# Patient Record
Sex: Female | Born: 1980 | Race: Black or African American | Hispanic: No | Marital: Married | Smoking: Never smoker
Health system: Southern US, Community
[De-identification: ages and names within clinical notes are randomized; demographics above are authoritative.]

## PROBLEM LIST (undated history)

## (undated) DIAGNOSIS — G932 Benign intracranial hypertension: Secondary | ICD-10-CM

## (undated) DIAGNOSIS — L732 Hidradenitis suppurativa: Secondary | ICD-10-CM

## (undated) HISTORY — DX: Benign intracranial hypertension: G93.2

## (undated) HISTORY — PX: AXILLARY HIDRADENITIS EXCISION: SUR522

## (undated) HISTORY — PX: ABDOMINAL HYSTERECTOMY: SHX81

---

## 2010-07-24 DIAGNOSIS — Z6841 Body Mass Index (BMI) 40.0 and over, adult: Secondary | ICD-10-CM | POA: Insufficient documentation

## 2012-01-11 DIAGNOSIS — N739 Female pelvic inflammatory disease, unspecified: Secondary | ICD-10-CM | POA: Insufficient documentation

## 2016-06-13 DIAGNOSIS — L732 Hidradenitis suppurativa: Secondary | ICD-10-CM | POA: Insufficient documentation

## 2016-06-16 DIAGNOSIS — B3731 Acute candidiasis of vulva and vagina: Secondary | ICD-10-CM | POA: Insufficient documentation

## 2018-12-22 ENCOUNTER — Other Ambulatory Visit: Payer: Self-pay

## 2018-12-22 ENCOUNTER — Encounter: Payer: Self-pay | Admitting: Emergency Medicine

## 2018-12-22 ENCOUNTER — Emergency Department
Admission: EM | Admit: 2018-12-22 | Discharge: 2018-12-22 | Disposition: A | Discharge status: Home / Self Care | Payer: BC Managed Care – PPO | Source: Home / Self Care | Attending: Family Medicine | Admitting: Family Medicine

## 2018-12-22 DIAGNOSIS — L732 Hidradenitis suppurativa: Secondary | ICD-10-CM

## 2018-12-22 HISTORY — DX: Hidradenitis suppurativa: L73.2

## 2018-12-22 MED ORDER — METHYLPREDNISOLONE SODIUM SUCC 125 MG IJ SOLR
80.0000 mg | Freq: Once | INTRAMUSCULAR | Status: AC
  Administered 2018-12-22: 80 mg via INTRAMUSCULAR

## 2018-12-22 MED ORDER — HYDROCODONE-ACETAMINOPHEN 5-325 MG PO TABS: ORAL_TABLET | ORAL | 0 refills | Status: DC

## 2018-12-22 MED ORDER — CLINDAMYCIN HCL 300 MG PO CAPS: ORAL_CAPSULE | ORAL | 0 refills | Status: DC

## 2018-12-22 MED ORDER — PREDNISONE 20 MG PO TABS: ORAL_TABLET | ORAL | 0 refills | Status: DC

## 2018-12-22 NOTE — ED Provider Notes (Signed)
[facility]    CSN: [id number] Arrival date & time: 12/22/18  [id number]     History   Chief Complaint Chief Complaint  Patient presents with  . Abscess    HPI Allison York is a 38 y.o. female.   Patient has a long history of hidradenitis suppurativa, managed presently by Humira.  She just moved to the area, and has had a flare-up from the stress of moving.  She has had multiple treatments in the past, including surgery, topical clindamycin, minocycline, and intralesional steroids.  She presently has a draining lesion on her chest, right groin and right buttock. She feels well otherwise.  No fevers, chills, and sweats. She denies history of diabetes, but has family history of diabetes.  The history is provided by the patient.    Past Medical History:  Diagnosis Date  . Hidradenitis suppurativa     There are no active problems to display for this patient.   Past Surgical History:  Procedure Laterality Date  . ABDOMINAL HYSTERECTOMY    . AXILLARY HIDRADENITIS EXCISION      OB History   No obstetric history on file.      Home Medications    Prior to Admission medications   Medication Sig Start Date End Date Taking? Authorizing Provider  Adalimumab (HUMIRA) 10 MG/0.1ML PSKT Inject into the skin.   Yes [provider]  clindamycin (CLEOCIN) 300 MG capsule Take one cap PO Q8hr 12/22/18   Lattie HawBeese, Eston Heslin A, MD  HYDROcodone-acetaminophen (NORCO/VICODIN) 5-325 MG tablet Take one by mouth at bedtime as needed for pain 12/22/18   Lattie HawBeese, Alayasia Breeding A, MD  predniSONE (DELTASONE) 20 MG tablet Take one tab by mouth twice daily for 4 days, then one daily for 3 days. Take with food. 12/22/18   Lattie HawBeese, Zlata Alcaide A, MD    Family History Family History  Problem Relation Age of Onset  . Cancer Mother   . Diabetes Father   . Hypertension Father     Social History Social History   Tobacco Use  . Smoking status: Never Smoker  . Smokeless tobacco: Never Used   Substance Use Topics  . Alcohol use: Yes  . Drug use: Not Currently     Allergies   Patient has no known allergies.   Review of Systems Review of Systems  Constitutional: Negative for appetite change, chills, diaphoresis, fatigue and fever.  Skin: Positive for wound.  All other systems reviewed and are negative.    Physical Exam Triage Vital Signs ED Triage Vitals  Enc Vitals Group     BP      Pulse      Resp      Temp      Temp src      SpO2      Weight      Height      Head Circumference      Peak Flow      Pain Score      Pain Loc      Pain Edu?      Excl. in GC?    No data found.  Updated Vital Signs BP 127/88 (BP Location: Right Arm)   Pulse 75   Temp 97.9 F (36.6 C) (Oral)   Ht 5\' 6"  (1.676 m)   Wt 110.2 kg   SpO2 97%   BMI 39.22 kg/m   Visual Acuity Right Eye Distance:   Left Eye Distance:   Bilateral Distance:    Right Eye Near:  Left Eye Near:    Bilateral Near:     Physical Exam Vitals signs and nursing note reviewed.  Constitutional:      General: She is not in acute distress.    Appearance: She is obese.  HENT:     Head: Normocephalic.     Mouth/Throat:     Mouth: Mucous membranes are moist.  Eyes:     Conjunctiva/sclera: Conjunctivae normal.     Pupils: Pupils are equal, round, and reactive to light.  Cardiovascular:     Rate and Rhythm: Normal rate.  Pulmonary:     Effort: Pulmonary effort is normal.  Musculoskeletal:     Right lower leg: No edema.     Left lower leg: No edema.  Skin:    General: Skin is warm and dry.          Comments: Numerous areas of scarring axillary areas, groin, buttocks.  Draining fluctuant lesion mid-chest.  Indurated lesions right groin and right buttock.  Neurological:     Mental Status: She is alert.      UC Treatments / Results  Labs (all labs ordered are listed, but only abnormal results are displayed) Labs Reviewed  WOUND CULTURE    EKG   Radiology No results found.   Procedures Procedures (including critical care time)  Medications Ordered in UC Medications  methylPREDNISolone sodium succinate (SOLU-MEDROL) 125 mg/2 mL injection 80 mg (has no administration in time range)    Initial Impression / Assessment and Plan / UC Course  I have reviewed the triage vital signs and the nursing notes.  Pertinent labs & imaging results that were available during my care of the patient were reviewed by me and considered in my medical decision making (see chart for details).    Administered Solumedrol 80mg  IM; begin prednisone burst/taper. Culture pending from purulent fluid lesion mid-chest. Begin Clindamycin 300mg  Q8hr. Rx for Lortab at bedtime (#5, no refill). Controlled Substance Prescriptions I have consulted the [place] Controlled Substances Registry for this patient, and feel the risk/benefit ratio today is favorable for proceeding with this prescription for a controlled substance.   Followup with Dermatologist for long term management. Patient has risk factors for metabolic syndrome; ?polycystic ovarian syndrome.  Would benefit from trial of metformin.   Final Clinical Impressions(s) / UC Diagnoses   Final diagnoses:  Hidradenitis suppurativa     Discharge Instructions     Begin prednisone Thursday 12/23/18. May take Tylenol as needed for pain daytime.    ED Prescriptions    Medication Sig Dispense Auth. Provider   clindamycin (CLEOCIN) 300 MG capsule Take one cap PO Q8hr 21 capsule Kandra Nicolas, MD   predniSONE (DELTASONE) 20 MG tablet Take one tab by mouth twice daily for 4 days, then one daily for 3 days. Take with food. 11 tablet Kandra Nicolas, MD   HYDROcodone-acetaminophen (NORCO/VICODIN) 5-325 MG tablet Take one by mouth at bedtime as needed for pain 5 tablet Kandra Nicolas, MD         Kandra Nicolas, MD 12/22/18 1038

## 2018-12-22 NOTE — Discharge Instructions (Addendum)
Begin prednisone Thursday 12/23/18. May take Tylenol as needed for pain daytime.

## 2018-12-22 NOTE — ED Triage Notes (Signed)
Hidradenitis recurrent, started 2 days ago, in groin, under left breast, under buttock. Patient just moved here this week.

## 2018-12-24 ENCOUNTER — Telehealth: Payer: Self-pay

## 2018-12-24 MED ORDER — FLUCONAZOLE 150 MG PO TABS: 150.0000 mg | ORAL_TABLET | Freq: Once | ORAL | 0 refills | Status: AC

## 2018-12-24 NOTE — Telephone Encounter (Signed)
Pt called and stated that she gets yeast infections with antibiotics.  Spoke with Tilden Fossa, PAC, diflucan 150 mg sent to pharmacy

## 2018-12-25 ENCOUNTER — Telehealth: Payer: Self-pay | Admitting: Emergency Medicine

## 2018-12-25 LAB — WOUND CULTURE
MICRO NUMBER:: 646062
SPECIMEN QUALITY:: ADEQUATE

## 2018-12-25 NOTE — Telephone Encounter (Signed)
Patient informed of negative would culture results.  Patient states that she is doing better and will follow up as needed with PCP.

## 2019-01-20 ENCOUNTER — Emergency Department
Admission: EM | Admit: 2019-01-20 | Discharge: 2019-01-20 | Disposition: A | Discharge status: Home / Self Care | Payer: BC Managed Care – PPO | Source: Home / Self Care | Attending: Emergency Medicine | Admitting: Emergency Medicine

## 2019-01-20 ENCOUNTER — Other Ambulatory Visit: Payer: Self-pay

## 2019-01-20 ENCOUNTER — Emergency Department (INDEPENDENT_AMBULATORY_CARE_PROVIDER_SITE_OTHER): Payer: BC Managed Care – PPO

## 2019-01-20 DIAGNOSIS — H579 Unspecified disorder of eye and adnexa: Secondary | ICD-10-CM

## 2019-01-20 DIAGNOSIS — R930 Abnormal findings on diagnostic imaging of skull and head, not elsewhere classified: Secondary | ICD-10-CM

## 2019-01-20 DIAGNOSIS — R51 Headache: Secondary | ICD-10-CM | POA: Diagnosis not present

## 2019-01-20 DIAGNOSIS — E236 Other disorders of pituitary gland: Secondary | ICD-10-CM

## 2019-01-20 DIAGNOSIS — R110 Nausea: Secondary | ICD-10-CM

## 2019-01-20 NOTE — ED Provider Notes (Signed)
[facility]    CSN: [id number] Arrival date & time: 01/20/19  0915     History   Chief Complaint Chief Complaint  Patient presents with  . Headache  . Tinnitus  . Nausea    HPI Allison York is a 38 y.o. female.   HPI Patient presents with a 4-day history of a bifrontal headache and a level of 6-7.  She has had nausea but no vomiting.  She has not had a fever.  She has tried ibuprofen and Tylenol without improvement.  She has not had a history of headaches.  She is currently on Humira for hidradenitis separative.  She does have a ringing in her ears at times. Past Medical History:  Diagnosis Date  . Hidradenitis suppurativa     There are no active problems to display for this patient.   Past Surgical History:  Procedure Laterality Date  . ABDOMINAL HYSTERECTOMY    . AXILLARY HIDRADENITIS EXCISION    . CESAREAN SECTION      OB History   No obstetric history on file.      Home Medications    Prior to Admission medications   Medication Sig Start Date End Date Taking? Authorizing Provider  Adalimumab (HUMIRA) 10 MG/0.1ML PSKT Inject into the skin.    [provider]  clindamycin (CLEOCIN) 300 MG capsule Take one cap PO Q8hr 12/22/18   Lattie HawBeese, Stephen A, MD  HYDROcodone-acetaminophen (NORCO/VICODIN) 5-325 MG tablet Take one by mouth at bedtime as needed for pain 12/22/18   Lattie HawBeese, Stephen A, MD  predniSONE (DELTASONE) 20 MG tablet Take one tab by mouth twice daily for 4 days, then one daily for 3 days. Take with food. 12/22/18   Lattie HawBeese, Stephen A, MD    Family History Family History  Problem Relation Age of Onset  . Cancer Mother   . Diabetes Father   . Hypertension Father     Social History Social History   Tobacco Use  . Smoking status: Never Smoker  . Smokeless tobacco: Never Used  Substance Use Topics  . Alcohol use: Yes  . Drug use: Not Currently     Allergies   Patient has no known allergies.   Review of Systems Review of  Systems  Constitutional: Negative.   HENT:       She has had a ringing muffled sensation in her ears but no sore throat  Eyes: Negative.   Respiratory: Negative.   Cardiovascular: Negative.   Gastrointestinal: Negative.      Physical Exam Triage Vital Signs ED Triage Vitals  Enc Vitals Group     BP 01/20/19 1021 134/84     Pulse Rate 01/20/19 1021 66     Resp 01/20/19 1021 20     Temp 01/20/19 1021 98.2 F (36.8 C)     Temp Source 01/20/19 1021 Oral     SpO2 01/20/19 1021 100 %     Weight 01/20/19 1022 236 lb (107 kg)     Height 01/20/19 1022 5\' 6"  (1.676 m)     Head Circumference --      Peak Flow --      Pain Score 01/20/19 1022 7     Pain Loc --      Pain Edu? --      Excl. in GC? --    No data found.  Updated Vital Signs BP 134/84 (BP Location: Right Arm)   Pulse 66   Temp 98.2 F (36.8 C) (Oral)   Resp  20   Ht 5\' 6"  (1.676 m)   Wt 107 kg   SpO2 100%   BMI 38.09 kg/m   Visual Acuity Right Eye Distance:   Left Eye Distance:   Bilateral Distance:    Right Eye Near:   Left Eye Near:    Bilateral Near:     Physical Exam Constitutional:      Appearance: She is well-developed.  HENT:     Head: Normocephalic.  Eyes:     Extraocular Movements: Extraocular movements intact.     Pupils: Pupils are equal, round, and reactive to light.     Comments: This margins were indistinct on the left side.  The right optic disc could not be visualized.  Neck:     Musculoskeletal: Normal range of motion and neck supple.  Cardiovascular:     Rate and Rhythm: Normal rate and regular rhythm.  Pulmonary:     Effort: Pulmonary effort is normal.     Breath sounds: Normal breath sounds.  Neurological:     Mental Status: She is alert.     Cranial Nerves: No cranial nerve deficit or facial asymmetry.     Gait: Gait normal.     Deep Tendon Reflexes: Reflexes normal.  Psychiatric:        Mood and Affect: Mood normal.        Speech: Speech normal.        Behavior:  Behavior normal.      UC Treatments / Results  Labs (all labs ordered are listed, but only abnormal results are displayed) Labs Reviewed  NOVEL CORONAVIRUS, NAA    EKG   Radiology Ct Head Wo Contrast  Result Date: 01/20/2019 CLINICAL DATA:  Acute headache.  Suspect pseudotumor cerebri. EXAM: CT HEAD WITHOUT CONTRAST TECHNIQUE: Contiguous axial images were obtained from the base of the skull through the vertex without intravenous contrast. COMPARISON:  None. FINDINGS: Brain: Ventricle size normal. Negative for infarct, hemorrhage, mass. Marked enlargement of the sella which is filled with CSF. No significant pituitary tissue is identified. Findings compatible with empty sella syndrome. Vascular: Negative for hyperdense vessel Skull: Negative Sinuses/Orbits: Negative Other: None IMPRESSION: Empty sella. Marked enlargement of the sella filled with CSF with compressed or hypoplastic pituitary. This finding can be associated with pseudotumor cerebri however is nonspecific. Otherwise negative Electronically Signed   By: Marlan Palauharles  Clark M.D.   On: 01/20/2019 12:03    Procedures Procedures (including critical care time)  Medications Ordered in UC Medications - No data to display  Initial Impression / Assessment and Plan / UC Course  I have reviewed the triage vital signs and the nursing notes. I was concerned these headaches could be representative of pseudotumor cerebri.  Of note she is on Humira for hidradenitis separative.  She has not had fever or chills.  She has had multiple people in her office test positive for COVID but she has not had testing until today.  Called Dr. Coral Elseyan's office and spoke to his nurse.  He is having me fax the medical record to their office and they will contact her with an appointment.  In the interim the patient will take extra strength Tylenol 2 up to twice a day.  She will also be on ibuprofen 200 mg 3 tablets twice a day with food pending her evaluation with a  neurologist.  Cover testing was done.  She is given a note for work for the rest of this week and next week.  She was advised we  do not have a diagnosis yet we have an abnormal CT scan which needs to be evaluated as well as further evaluation of the abnormal eye exam. Pertinent labs & imaging results that were available during my care of the patient were reviewed by me and considered in my medical decision making (see chart for details).      Final Clinical Impressions(s) / UC Diagnoses   Final diagnoses:  Abnormal CT of the head  Empty sella syndrome (HCC)  Nausea  Eye exam abnormal     Discharge Instructions     You are being referred to Dr. run #who is with [facility] neurology for evaluation. Their phone number is [phone]. I have spoken with their nurse and they will contact you this afternoon about an early appointment. You can take ibuprofen 200 mg number 3 tablets twice a day with food. You can take 2 extra strength Tylenol up to twice a day. We have done COVID testing. He will be out of work for the next 10 days while you undergo your evaluation by the neurologist.    ED Prescriptions    None     Controlled Substance Prescriptions [place] Controlled Substance Registry consulted? Not Applicable   Darlyne Russian, MD 01/20/19 1248

## 2019-01-20 NOTE — Discharge Instructions (Addendum)
You are being referred to Dr. run #who is with [facility] neurology for evaluation. Their phone number is [phone]. I have spoken with their nurse and they will contact you this afternoon about an early appointment. You can take ibuprofen 200 mg number 3 tablets twice a day with food. You can take 2 extra strength Tylenol up to twice a day. We have done COVID testing. He will be out of work for the next 10 days while you undergo your evaluation by the neurologist. You are being evaluated for pseudotumor cerebra and empty sella syndrome

## 2019-01-20 NOTE — ED Triage Notes (Signed)
Headaches for the pat 3-4 days.  Has been waking from sleep with headache.  Had had a Wooshing sound in ears last 2 days.  Several people in work environment have tested positive for [place]

## 2019-01-21 DIAGNOSIS — G932 Benign intracranial hypertension: Secondary | ICD-10-CM | POA: Insufficient documentation

## 2019-01-22 ENCOUNTER — Telehealth: Payer: Self-pay | Admitting: Emergency Medicine

## 2019-01-22 LAB — NOVEL CORONAVIRUS, NAA: SARS-CoV-2, NAA: NOT DETECTED

## 2019-06-20 DIAGNOSIS — L280 Lichen simplex chronicus: Secondary | ICD-10-CM | POA: Insufficient documentation

## 2020-04-16 ENCOUNTER — Encounter: Payer: Self-pay | Admitting: Emergency Medicine

## 2020-04-16 ENCOUNTER — Emergency Department
Admission: EM | Admit: 2020-04-16 | Discharge: 2020-04-16 | Disposition: A | Discharge status: Home / Self Care | Payer: BC Managed Care – PPO | Source: Home / Self Care

## 2020-04-16 ENCOUNTER — Emergency Department: Admit: 2020-04-16 | Discharge status: Absent without leave | Payer: Self-pay

## 2020-04-16 ENCOUNTER — Other Ambulatory Visit: Payer: Self-pay

## 2020-04-16 ENCOUNTER — Emergency Department (INDEPENDENT_AMBULATORY_CARE_PROVIDER_SITE_OTHER): Payer: BC Managed Care – PPO

## 2020-04-16 DIAGNOSIS — R059 Cough, unspecified: Secondary | ICD-10-CM

## 2020-04-16 DIAGNOSIS — R6889 Other general symptoms and signs: Secondary | ICD-10-CM

## 2020-04-16 DIAGNOSIS — R509 Fever, unspecified: Secondary | ICD-10-CM

## 2020-04-16 DIAGNOSIS — L732 Hidradenitis suppurativa: Secondary | ICD-10-CM

## 2020-04-16 MED ORDER — ACETAMINOPHEN 500 MG PO TABS
1000.0000 mg | ORAL_TABLET | Freq: Once | ORAL | Status: AC
  Administered 2020-04-16: 1000 mg via ORAL

## 2020-04-16 MED ORDER — METHYLPREDNISOLONE SODIUM SUCC 40 MG IJ SOLR
80.0000 mg | Freq: Once | INTRAMUSCULAR | Status: AC
  Administered 2020-04-16: 80 mg via INTRAMUSCULAR

## 2020-04-16 MED ORDER — ALBUTEROL SULFATE HFA 108 (90 BASE) MCG/ACT IN AERS: 1.0000 | INHALATION_SPRAY | Freq: Four times a day (QID) | RESPIRATORY_TRACT | 0 refills | Status: DC | PRN

## 2020-04-16 MED ORDER — DOXYCYCLINE HYCLATE 100 MG PO CAPS: 100.0000 mg | ORAL_CAPSULE | Freq: Two times a day (BID) | ORAL | 0 refills | Status: DC

## 2020-04-16 NOTE — ED Provider Notes (Signed)
[facility]    CSN: [id number] Arrival date & time: 04/16/20  0903      History   Chief Complaint Chief Complaint  Patient presents with  . Cough    HPI Allison York is a 39 y.o. female.   HPI  Allison York is a 39 y.o. female presenting to UC with c/o 5 days of body aches, cough, congestion, fever. She had a negative COVID test 2 days ago. She has also been vaccinated with Pfizer COVID vaccine in March.  Denies sick contacts. She does have a hx of hidradenitris suppurativa and states that has flared up in her Left underarm and buttock again as well. States she has done well with steroid injection and antibiotics last time she had a flare. Denies n/v/d. No chest pain or SOB.    Past Medical History:  Diagnosis Date  . Hidradenitis suppurativa     There are no problems to display for this patient.   Past Surgical History:  Procedure Laterality Date  . ABDOMINAL HYSTERECTOMY    . AXILLARY HIDRADENITIS EXCISION    . CESAREAN SECTION      OB History   No obstetric history on file.      Home Medications    Prior to Admission medications   Medication Sig Start Date End Date Taking? Authorizing Provider  acetaZOLAMIDE (DIAMOX) 125 MG tablet Take 125 mg by mouth 3 (three) times daily.   Yes [provider]  dextromethorphan (DELSYM) 30 MG/5ML liquid Take by mouth as needed for cough.   Yes [provider]  DM-Phenylephrine-Acetaminophen (VICKS DAYQUIL COLD & FLU) 10-5-325 MG/15ML LIQD Take by mouth.   Yes [provider]  ibuprofen (ADVIL) 200 MG tablet Take 200 mg by mouth every 6 (six) hours as needed.   Yes [provider]  Adalimumab (HUMIRA) 10 MG/0.1ML PSKT Inject into the skin.    [provider]  albuterol (VENTOLIN HFA) 108 (90 Base) MCG/ACT inhaler Inhale 1-2 puffs into the lungs every 6 (six) hours as needed for wheezing or shortness of breath. 04/16/20   Lurene Shadow, PA-C  doxycycline  (VIBRAMYCIN) 100 MG capsule Take 1 capsule (100 mg total) by mouth 2 (two) times daily. 04/16/20   Lurene Shadow, PA-C    Family History Family History  Problem Relation Age of Onset  . Cancer Mother   . Diabetes Father   . Hypertension Father     Social History Social History   Tobacco Use  . Smoking status: Never Smoker  . Smokeless tobacco: Never Used  Vaping Use  . Vaping Use: Never used  Substance Use Topics  . Alcohol use: Yes  . Drug use: Not Currently     Allergies   Patient has no known allergies.   Review of Systems Review of Systems  Constitutional: Positive for chills, fatigue and fever.  HENT: Positive for congestion. Negative for ear pain, sore throat, trouble swallowing and voice change.   Respiratory: Positive for cough. Negative for shortness of breath.   Cardiovascular: Negative for chest pain and palpitations.  Gastrointestinal: Negative for abdominal pain, diarrhea, nausea and vomiting.  Musculoskeletal: Positive for arthralgias, back pain and myalgias.  Skin: Positive for color change. Negative for rash.  All other systems reviewed and are negative.    Physical Exam Triage Vital Signs ED Triage Vitals  Enc Vitals Group     BP 04/16/20 0927 124/82     Pulse Rate 04/16/20 0927 (!) 115  Resp --      Temp 04/16/20 0927 (!) 100.7 F (38.2 C)     Temp Source 04/16/20 0927 Oral     SpO2 04/16/20 0927 95 %     Weight 04/16/20 0928 233 lb (105.7 kg)     Height 04/16/20 0928 5\' 6"  (1.676 m)     Head Circumference --      Peak Flow --      Pain Score 04/16/20 0928 8     Pain Loc --      Pain Edu? --      Excl. in GC? --    No data found.  Updated Vital Signs BP 124/82 (BP Location: Right Arm)   Pulse (!) 115   Temp (!) 100.7 F (38.2 C) (Oral)   Ht 5\' 6"  (1.676 m)   Wt 233 lb (105.7 kg)   SpO2 95%   BMI 37.61 kg/m   Visual Acuity Right Eye Distance:   Left Eye Distance:   Bilateral Distance:    Right Eye Near:   Left Eye  Near:    Bilateral Near:     Physical Exam Vitals and nursing note reviewed.  Constitutional:      General: She is not in acute distress.    Appearance: Normal appearance. She is well-developed. She is obese. She is not ill-appearing, toxic-appearing or diaphoretic.  HENT:     Head: Normocephalic and atraumatic.     Right Ear: Tympanic membrane and ear canal normal.     Left Ear: Tympanic membrane and ear canal normal.     Nose: Nose normal.     Mouth/Throat:     Mouth: Mucous membranes are moist.     Pharynx: Oropharynx is clear.  Cardiovascular:     Rate and Rhythm: Normal rate and regular rhythm.  Pulmonary:     Effort: Pulmonary effort is normal. No respiratory distress.     Breath sounds: Normal breath sounds. No stridor. No wheezing, rhonchi or rales.  Abdominal:     General: There is no distension.     Palpations: Abdomen is soft.     Tenderness: There is no abdominal tenderness.  Musculoskeletal:        General: Normal range of motion.     Cervical back: Normal range of motion and neck supple. No tenderness.  Lymphadenopathy:     Cervical: No cervical adenopathy.  Skin:    General: Skin is warm and dry.     Findings: Erythema present.       Neurological:     Mental Status: She is alert and oriented to person, place, and time.  Psychiatric:        Behavior: Behavior normal.      UC Treatments / Results  Labs (all labs ordered are listed, but only abnormal results are displayed) Labs Reviewed  COVID-19, FLU A+B AND RSV   Narrative:    Test(s) 140142-Influenza A, NAA; 140143-Influenza B, NAA; 140144- RSV, NAA was developed and its performance characteristics determined by [facility]. It has not been cleared or approved by the Food and Drug Administration. Performed at:  [address], [place], [address]  [place] [facility] Director: [place] MD, Phone:  [phone]    EKG   Radiology  Narrative & Impression  CLINICAL DATA:   Cough, congestion, fever  EXAM: CHEST - 2 VIEW  COMPARISON:  None.  FINDINGS: The heart size and mediastinal contours are within normal limits. Both lungs are clear. No pleural effusion.  The visualized skeletal structures are unremarkable.  IMPRESSION: No acute process in the chest.   Electronically Signed   By: Guadlupe Spanish M.D.   On: 04/16/2020 10:12    Procedures Procedures (including critical care time)  Medications Ordered in UC Medications  acetaminophen (TYLENOL) tablet 1,000 mg (1,000 mg Oral Given 04/16/20 1009)  methylPREDNISolone sodium succinate (SOLU-MEDROL) 40 mg/mL injection 80 mg (80 mg Intramuscular Given 04/16/20 1009)    Initial Impression / Assessment and Plan / UC Course  I have reviewed the triage vital signs and the nursing notes.  Pertinent labs & imaging results that were available during my care of the patient were reviewed by me and considered in my medical decision making (see chart for details).     Will tx as previously tx for hidradenitis flare with solumedrol and antibiotics Due to recent use of clindamycin, will change pt to doxycycline today F/u with PCP AVS given  Final Clinical Impressions(s) / UC Diagnoses   Final diagnoses:  Flu-like symptoms  Hidradenitis suppurativa     Discharge Instructions      You may take 500mg  acetaminophen every 4-6 hours or in combination with ibuprofen 400-600mg  every 6-8 hours as needed for pain, inflammation, and fever.  Be sure to well hydrated with clear liquids and get at least 8 hours of sleep at night, preferably more while sick.   Please follow up with family medicine in 1 week if needed.     ED Prescriptions    Medication Sig Dispense Auth. Provider   doxycycline (VIBRAMYCIN) 100 MG capsule Take 1 capsule (100 mg total) by mouth 2 (two) times daily. 20 capsule , Raquon Milledge O, PA-C   albuterol (VENTOLIN HFA) 108 (90 Base) MCG/ACT inhaler Inhale 1-2 puffs into the lungs  every 6 (six) hours as needed for wheezing or shortness of breath. 18 g Doroteo Glassman, PA-C     PDMP not reviewed this encounter.   Lurene Shadow, Lurene Shadow 04/19/20 [phone]

## 2020-04-16 NOTE — Discharge Instructions (Signed)
  You may take 500mg acetaminophen every 4-6 hours or in combination with ibuprofen 400-600mg every 6-8 hours as needed for pain, inflammation, and fever.  Be sure to well hydrated with clear liquids and get at least 8 hours of sleep at night, preferably more while sick.   Please follow up with family medicine in 1 week if needed.   

## 2020-04-16 NOTE — ED Triage Notes (Addendum)
Cough, body aches, fever x 5 days, had negative Covid test on Saturday, she was vaccinated in March

## 2020-04-18 LAB — COVID-19, FLU A+B AND RSV
Influenza A, NAA: NOT DETECTED
Influenza B, NAA: NOT DETECTED
RSV, NAA: NOT DETECTED
SARS-CoV-2, NAA: NOT DETECTED

## 2020-04-19 ENCOUNTER — Telehealth: Payer: Self-pay | Admitting: Emergency Medicine

## 2020-04-19 NOTE — Telephone Encounter (Signed)
Call back to [facility] regarding a message left on nurse line to request additional time on her return to work note. Pt still has a fever and a sore throat. Pt was seen by Waylan Rocher, PA-C on Monday. Per Denny Peon, pt would need to be seen again or follow up w/ her PCP. Pt stated she did not have a PCP. RN stated pt needed to be seen for re-evaluation

## 2020-06-11 DIAGNOSIS — D84821 Immunodeficiency due to drugs: Secondary | ICD-10-CM | POA: Insufficient documentation

## 2020-06-22 DIAGNOSIS — Z5181 Encounter for therapeutic drug level monitoring: Secondary | ICD-10-CM | POA: Insufficient documentation

## 2020-12-24 ENCOUNTER — Other Ambulatory Visit: Payer: Self-pay

## 2020-12-24 ENCOUNTER — Emergency Department
Admission: RE | Admit: 2020-12-24 | Discharge: 2020-12-24 | Disposition: A | Discharge status: Home / Self Care | Payer: BC Managed Care – PPO | Source: Ambulatory Visit

## 2020-12-24 VITALS — BP 145/85 | HR 78 | Temp 98.7°F | Resp 15 | Ht 66.0 in | Wt 240.0 lb

## 2020-12-24 DIAGNOSIS — J029 Acute pharyngitis, unspecified: Secondary | ICD-10-CM

## 2020-12-24 DIAGNOSIS — R52 Pain, unspecified: Secondary | ICD-10-CM

## 2020-12-24 DIAGNOSIS — R059 Cough, unspecified: Secondary | ICD-10-CM | POA: Diagnosis not present

## 2020-12-24 DIAGNOSIS — M545 Low back pain, unspecified: Secondary | ICD-10-CM | POA: Diagnosis not present

## 2020-12-24 DIAGNOSIS — L732 Hidradenitis suppurativa: Secondary | ICD-10-CM

## 2020-12-24 LAB — POCT URINALYSIS DIP (MANUAL ENTRY)
Bilirubin, UA: NEGATIVE
Blood, UA: NEGATIVE
Glucose, UA: NEGATIVE mg/dL
Ketones, POC UA: NEGATIVE mg/dL
Leukocytes, UA: NEGATIVE
Nitrite, UA: NEGATIVE
Protein Ur, POC: NEGATIVE mg/dL
Spec Grav, UA: 1.025 (ref 1.010–1.025)
Urobilinogen, UA: 0.2 E.U./dL
pH, UA: 6 (ref 5.0–8.0)

## 2020-12-24 LAB — POC SARS CORONAVIRUS 2 AG -  ED: SARS Coronavirus 2 Ag: NEGATIVE

## 2020-12-24 LAB — POCT RAPID STREP A (OFFICE): Rapid Strep A Screen: NEGATIVE

## 2020-12-24 MED ORDER — METHYLPREDNISOLONE ACETATE 40 MG/ML IJ SUSP
40.0000 mg | Freq: Once | INTRAMUSCULAR | Status: AC
  Administered 2020-12-24: 40 mg via INTRAMUSCULAR

## 2020-12-24 MED ORDER — LIDOCAINE VISCOUS HCL 2 % MT SOLN: 15.0000 mL | Freq: Three times a day (TID) | OROMUCOSAL | 0 refills | Status: DC

## 2020-12-24 MED ORDER — CLINDAMYCIN HCL 300 MG PO CAPS: 300.0000 mg | ORAL_CAPSULE | Freq: Three times a day (TID) | ORAL | 0 refills | Status: DC

## 2020-12-24 NOTE — ED Triage Notes (Signed)
Abdominal pain on Wed - resolved  Headache, lower back pain & sore throat since Thursday  OTC - ibuprofen at 0400 & tylenol  Salt water gargles  Coworkers + for COVID  Covid vaccine + booster

## 2020-12-24 NOTE — Discharge Instructions (Addendum)
Your urine was normal.  Your COVID test and strep were negative.  We will contact you if your strep is positive.  We gave you a injection of steroids today to help with your symptoms.  I am also starting on an antibiotic that would cover for throat infection as well as a chest.  Please take this 3 times a day for a week.  This can give you a very severe case of diarrhea and if that happens please stop the medication and be seen immediately.  If you have any worsening symptoms you need to go to the emergency room.

## 2020-12-24 NOTE — ED Provider Notes (Signed)
[facility]    CSN: [id number] Arrival date & time: 12/24/20  0930      History   Chief Complaint Chief Complaint  Patient presents with   Sore Throat    HPI Jury Caserta is a 40 y.o. female.   Patient presents today with a 5-day history of URI symptoms.  Reports symptoms began as abdominal pain but this is since resolved.  She is experiencing significant sore throat which is her most bothersome symptom.  Pain is rated 3/4 on a 0-10 pain scale, localized to posterior oropharynx without radiation, described as sharp, worse with swallowing, no relieving factors identified.  She has tried multiple over-the-counter medications including Tylenol, honey, warm salt water gargles, ibuprofen, vinegar water solution without improvement of symptoms.  She is able to eat and drink but is having difficulty.  She is having significant lower back pain but denies any urinary symptoms.  She is unsure if body aches are related to illness or something else.  She does report sick contacts at work who have tested positive for COVID-19.  She is up-to-date on COVID vaccination including booster.  She does have a history of hidradenitis suppurativa and is treated with Humira.  Denies additional immunosuppression or significant past medical history.  Denies any recent antibiotic use.  She has tried DayQuil and NyQuil without improvement of symptoms.  Denies history of allergies, asthma, COPD, smoking.  Patient reports that every time she gets sick she has a flare of hidradenitis suppurativa.  She is requesting antibiotics and steroids to manage current flare.   Past Medical History:  Diagnosis Date   Hidradenitis suppurativa     There are no problems to display for this patient.   Past Surgical History:  Procedure Laterality Date   ABDOMINAL HYSTERECTOMY     AXILLARY HIDRADENITIS EXCISION     CESAREAN SECTION      OB History   No obstetric history on file.      Home Medications     Prior to Admission medications   Medication Sig Start Date End Date Taking? Authorizing Provider  clindamycin (CLEOCIN) 300 MG capsule Take 1 capsule (300 mg total) by mouth 3 (three) times daily. 12/24/20  Yes Josel Keo K, PA-C  acetaZOLAMIDE (DIAMOX) 125 MG tablet Take 125 mg by mouth 3 (three) times daily.    [provider]  Adalimumab (HUMIRA) 10 MG/0.1ML PSKT Inject into the skin.    [provider]  albuterol (VENTOLIN HFA) 108 (90 Base) MCG/ACT inhaler Inhale 1-2 puffs into the lungs every 6 (six) hours as needed for wheezing or shortness of breath. 04/16/20   Lurene Shadow, PA-C  dapsone 25 MG tablet Take 25 mg by mouth daily. 08/25/20   [provider]  dextromethorphan (DELSYM) 30 MG/5ML liquid Take by mouth as needed for cough. Patient not taking: Reported on 12/24/2020    [provider]  DM-Phenylephrine-Acetaminophen (VICKS DAYQUIL COLD & FLU) 10-5-325 MG/15ML LIQD Take by mouth. Patient not taking: Reported on 12/24/2020    [provider]  ibuprofen (ADVIL) 200 MG tablet Take 200 mg by mouth every 6 (six) hours as needed.    [provider]    Family History Family History  Problem Relation Age of Onset   Cancer Mother    Diabetes Father    Hypertension Father     Social History Social History   Tobacco Use   Smoking status: Never   Smokeless tobacco: Never  Vaping Use   Vaping  Use: Never used  Substance Use Topics   Alcohol use: Yes   Drug use: Not Currently     Allergies   Patient has no known allergies.   Review of Systems Review of Systems  Constitutional:  Positive for activity change. Negative for appetite change, fatigue and fever.  HENT:  Positive for congestion and sore throat. Negative for sinus pressure and sneezing.   Respiratory:  Positive for cough. Negative for shortness of breath.   Cardiovascular:  Negative for chest pain.  Gastrointestinal:  Negative for abdominal pain (resolved),  diarrhea, nausea and vomiting.  Musculoskeletal:  Positive for arthralgias, back pain and myalgias.  Neurological:  Positive for headaches. Negative for dizziness and light-headedness.    Physical Exam Triage Vital Signs ED Triage Vitals  Enc Vitals Group     BP 12/24/20 0946 (!) 145/85     Pulse Rate 12/24/20 0946 78     Resp 12/24/20 0946 15     Temp 12/24/20 0946 98.7 F (37.1 C)     Temp Source 12/24/20 0946 Oral     SpO2 12/24/20 0946 99 %     Weight 12/24/20 0951 240 lb (108.9 kg)     Height 12/24/20 0951 5\' 6"  (1.676 m)     Head Circumference --      Peak Flow --      Pain Score 12/24/20 0949 2     Pain Loc --      Pain Edu? --      Excl. in GC? --    No data found.  Updated Vital Signs BP (!) 145/85 (BP Location: Right Arm)   Pulse 78   Temp 98.7 F (37.1 C) (Oral)   Resp 15   Ht 5\' 6"  (1.676 m)   Wt 240 lb (108.9 kg)   SpO2 99%   BMI 38.74 kg/m   Visual Acuity Right Eye Distance:   Left Eye Distance:   Bilateral Distance:    Right Eye Near:   Left Eye Near:    Bilateral Near:     Physical Exam Vitals reviewed.  Constitutional:      General: She is awake. She is not in acute distress.    Appearance: Normal appearance. She is normal weight. She is not ill-appearing.     Comments: Very pleasant female appears stated age in no acute distress sitting comfortably in exam room  HENT:     Head: Normocephalic and atraumatic.     Right Ear: Tympanic membrane, ear canal and external ear normal. Tympanic membrane is not erythematous or bulging.     Left Ear: Tympanic membrane, ear canal and external ear normal. Tympanic membrane is not erythematous or bulging.     Nose:     Right Sinus: No maxillary sinus tenderness or frontal sinus tenderness.     Left Sinus: No maxillary sinus tenderness or frontal sinus tenderness.     Mouth/Throat:     Pharynx: Uvula midline. Posterior oropharyngeal erythema present. No oropharyngeal exudate.     Comments: Significant  erythema present posterior oropharynx Cardiovascular:     Rate and Rhythm: Normal rate and regular rhythm.     Heart sounds: Normal heart sounds, S1 normal and S2 normal. No murmur heard. Pulmonary:     Effort: Pulmonary effort is normal.     Breath sounds: Normal breath sounds. No wheezing, rhonchi or rales.     Comments: Clear to auscultation bilaterally Abdominal:     General: Bowel sounds are normal.  Palpations: Abdomen is soft.     Tenderness: There is no abdominal tenderness. There is no right CVA tenderness, left CVA tenderness, guarding or rebound.  Musculoskeletal:     Cervical back: Normal range of motion and neck supple.  Lymphadenopathy:     Head:     Right side of head: No submental, submandibular or tonsillar adenopathy.     Left side of head: No submental, submandibular or tonsillar adenopathy.     Cervical: No cervical adenopathy.  Skin:    Comments: Multiple abscesses noted bilateral axilla.  No bleeding or drainage noted.  No evidence of streaking or lymphangitis.  Psychiatric:        Behavior: Behavior is cooperative.     UC Treatments / Results  Labs (all labs ordered are listed, but only abnormal results are displayed) Labs Reviewed  POCT URINALYSIS DIP (MANUAL ENTRY) - Abnormal; Notable for the following components:      Result Value   Color, UA other (*)    All other components within normal limits  CULTURE, GROUP A STREP  POCT RAPID STREP A (OFFICE)  POC SARS CORONAVIRUS 2 AG -  ED    EKG   Radiology No results found.  Procedures Procedures (including critical care time)  Medications Ordered in UC Medications  methylPREDNISolone acetate (DEPO-MEDROL) injection 40 mg (has no administration in time range)    Initial Impression / Assessment and Plan / UC Course  I have reviewed the triage vital signs and the nursing notes.  Pertinent labs & imaging results that were available during my care of the patient were reviewed by me and  considered in my medical decision making (see chart for details).      Rapid strep was negative in clinic today.  Throat culture obtained-results pending.  Point-of-care COVID was negative.  UA obtained given back pain showed no evidence of infection.  Discussed that symptoms are likely related to viral etiology but given flare of hidradenitis suppurativa.  She was given Depo-Medrol in clinic to help manage symptoms and started on clindamycin as this will cover for both skin and throat infections.  She was encouraged to use Tylenol over-the-counter for additional symptom relief.  Discussed alarm symptoms that warrant emergent evaluation.  Strict return precautions given to which patient expressed understanding.   Final Clinical Impressions(s) / UC Diagnoses   Final diagnoses:  Sore throat  Acute bilateral low back pain without sciatica  Body aches  Cough  Hidradenitis suppurativa     Discharge Instructions      Your urine was normal.  Your COVID test and strep were negative.  We will contact you if your strep is positive.  We gave you a injection of steroids today to help with your symptoms.  I am also starting on an antibiotic that would cover for throat infection as well as a chest.  Please take this 3 times a day for a week.  This can give you a very severe case of diarrhea and if that happens please stop the medication and be seen immediately.  If you have any worsening symptoms you need to go to the emergency room.     ED Prescriptions     Medication Sig Dispense Auth. Provider   clindamycin (CLEOCIN) 300 MG capsule Take 1 capsule (300 mg total) by mouth 3 (three) times daily. 21 capsule Olivea Sonnen K, PA-C      PDMP not reviewed this encounter.   Jeani Hawking, PA-C 12/24/20 1049

## 2020-12-27 LAB — CULTURE, GROUP A STREP: Strep A Culture: NEGATIVE

## 2021-01-03 ENCOUNTER — Telehealth: Payer: Self-pay

## 2021-01-03 MED ORDER — FLUCONAZOLE 150 MG PO TABS: ORAL_TABLET | ORAL | 0 refills | Status: DC

## 2021-01-03 NOTE — Telephone Encounter (Signed)
TC from PT stating she has completed recent abx course and now is experiencing a yeast infection. Consulted provider, and Per Trevor Iha NP orders received for diflucan.

## 2021-07-07 ENCOUNTER — Emergency Department (INDEPENDENT_AMBULATORY_CARE_PROVIDER_SITE_OTHER)
Admission: EM | Admit: 2021-07-07 | Discharge: 2021-07-07 | Disposition: A | Discharge status: Home / Self Care | Payer: BC Managed Care – PPO | Source: Home / Self Care

## 2021-07-07 ENCOUNTER — Emergency Department (INDEPENDENT_AMBULATORY_CARE_PROVIDER_SITE_OTHER): Payer: BC Managed Care – PPO

## 2021-07-07 ENCOUNTER — Other Ambulatory Visit: Payer: Self-pay

## 2021-07-07 DIAGNOSIS — M791 Myalgia, unspecified site: Secondary | ICD-10-CM

## 2021-07-07 DIAGNOSIS — R059 Cough, unspecified: Secondary | ICD-10-CM | POA: Diagnosis not present

## 2021-07-07 MED ORDER — PREDNISONE 10 MG PO TABS: ORAL_TABLET | ORAL | 0 refills | Status: DC

## 2021-07-07 MED ORDER — METHOCARBAMOL 500 MG PO TABS: 500.0000 mg | ORAL_TABLET | Freq: Four times a day (QID) | ORAL | 0 refills | Status: DC

## 2021-07-07 MED ORDER — ALBUTEROL SULFATE HFA 108 (90 BASE) MCG/ACT IN AERS: 2.0000 | INHALATION_SPRAY | Freq: Four times a day (QID) | RESPIRATORY_TRACT | 2 refills | Status: DC | PRN

## 2021-07-07 NOTE — Discharge Instructions (Addendum)
See your Physician for recheck in 2-3 days  

## 2021-07-07 NOTE — ED Triage Notes (Signed)
Pt states that she has some sob, and right leg pain. X1 week  Pt states that she also has some right lower back pain and right sided neck pain. X2 days

## 2021-07-07 NOTE — ED Provider Notes (Signed)
[facility]    CSN: [id number] Arrival date & time: 07/07/21  0813      History   Chief Complaint Chief Complaint  Patient presents with   Shortness of Breath    Shortness of breath, right leg pain. X1 week Right sided neck and right lower back pain. X2 days    HPI Allison York is a 41 y.o. female.   Pt reports she had pain in right heel, foot and leg last week.  Pt reports this week she has pain in upper back and necl.  Pt complains of feeling tired.  Pt has been using albuterol with some relief.  Pt denies any hormones, travel or prolonged sitting.  No chest pain   The history is provided by the patient. No language interpreter was used.  Shortness of Breath Severity:  Moderate Onset quality:  Gradual Duration:  1 week Timing:  Constant Progression:  Worsening Chronicity:  New Context: not URI   Relieved by:  Nothing Worsened by:  Nothing Ineffective treatments:  None tried Associated symptoms: no wheezing    Past Medical History:  Diagnosis Date   Hidradenitis suppurativa     There are no problems to display for this patient.   Past Surgical History:  Procedure Laterality Date   ABDOMINAL HYSTERECTOMY     AXILLARY HIDRADENITIS EXCISION     CESAREAN SECTION      OB History   No obstetric history on file.      Home Medications    Prior to Admission medications   Medication Sig Start Date End Date Taking? Authorizing Provider  acetaZOLAMIDE (DIAMOX) 125 MG tablet Take 125 mg by mouth 3 (three) times daily.   Yes [provider]  Adalimumab (HUMIRA) 10 MG/0.1ML PSKT Inject into the skin.   Yes [provider]  albuterol (VENTOLIN HFA) 108 (90 Base) MCG/ACT inhaler Inhale 1-2 puffs into the lungs every 6 (six) hours as needed for wheezing or shortness of breath. 04/16/20  Yes Phelps, Erin O, PA-C  clindamycin (CLEOCIN) 300 MG capsule Take 1 capsule (300 mg total) by mouth 3 (three) times daily. 12/24/20   Raspet, Noberto Retort,  PA-C  dapsone 25 MG tablet Take 25 mg by mouth daily. 08/25/20   [provider]  dextromethorphan (DELSYM) 30 MG/5ML liquid Take by mouth as needed for cough. Patient not taking: Reported on 12/24/2020    [provider]  DM-Phenylephrine-Acetaminophen (VICKS DAYQUIL COLD & FLU) 10-5-325 MG/15ML LIQD Take by mouth. Patient not taking: Reported on 12/24/2020    [provider]  fluconazole (DIFLUCAN) 150 MG tablet Take one dose now, repeat in 72 hours if symptoms persist 01/03/21   Trevor Iha, FNP  ibuprofen (ADVIL) 200 MG tablet Take 200 mg by mouth every 6 (six) hours as needed.    [provider]  lidocaine (XYLOCAINE) 2 % solution Use as directed 15 mLs in the mouth or throat every 8 (eight) hours. 12/24/20   Raspet, Noberto Retort, PA-C    Family History Family History  Problem Relation Age of Onset   Cancer Mother    Diabetes Father    Hypertension Father     Social History Social History   Tobacco Use   Smoking status: Never   Smokeless tobacco: Never  Vaping Use   Vaping Use: Never used  Substance Use Topics   Alcohol use: Yes   Drug use: Not Currently     Allergies   Patient has no known allergies.   Review  of Systems Review of Systems  Respiratory:  Positive for shortness of breath. Negative for wheezing.   All other systems reviewed and are negative.   Physical Exam Triage Vital Signs ED Triage Vitals  Enc Vitals Group     BP 07/07/21 0833 103/87     Pulse Rate 07/07/21 0833 73     Resp 07/07/21 0833 18     Temp 07/07/21 0833 98 F (36.7 C)     Temp Source 07/07/21 0833 Oral     SpO2 07/07/21 0833 97 %     Weight 07/07/21 0830 230 lb (104.3 kg)     Height 07/07/21 0830 5\' 6"  (1.676 m)     Head Circumference --      Peak Flow --      Pain Score 07/07/21 0829 8     Pain Loc --      Pain Edu? --      Excl. in GC? --    No data found.  Updated Vital Signs BP 103/87 (BP Location: Left Arm)    Pulse 73    Temp 98 F  (36.7 C) (Oral)    Resp 18    Ht 5\' 6"  (1.676 m)    Wt 104.3 kg    SpO2 97%    BMI 37.12 kg/m   Visual Acuity Right Eye Distance:   Left Eye Distance:   Bilateral Distance:    Right Eye Near:   Left Eye Near:    Bilateral Near:     Physical Exam Vitals and nursing note reviewed.  Constitutional:      Appearance: She is well-developed.  HENT:     Head: Normocephalic.  Cardiovascular:     Rate and Rhythm: Normal rate and regular rhythm.  Pulmonary:     Effort: Pulmonary effort is normal.  Chest:     Chest wall: No mass.  Abdominal:     General: There is no distension.     Palpations: Abdomen is soft.  Musculoskeletal:        General: Normal range of motion.     Cervical back: Normal range of motion.     Comments: Tender right trapezius  area   Skin:    General: Skin is warm.  Neurological:     Mental Status: She is alert and oriented to person, place, and time.     UC Treatments / Results  Labs (all labs ordered are listed, but only abnormal results are displayed) Labs Reviewed - No data to display  EKG   Radiology DG Chest 2 View  Result Date: 07/07/2021 CLINICAL DATA:  Cough EXAM: CHEST - 2 VIEW COMPARISON:  Chest x-ray 04/16/2020 FINDINGS: Heart size and mediastinal contours are within normal limits. No suspicious pulmonary opacities identified. No pleural effusion or pneumothorax visualized. No acute osseous abnormality appreciated. IMPRESSION: No acute intrathoracic process identified. Electronically Signed   By: 07/09/2021 M.D.   On: 07/07/2021 08:54    Procedures Procedures (including critical care time)  Medications Ordered in UC Medications - No data to display  Initial Impression / Assessment and Plan / UC Course  I have reviewed the triage vital signs and the nursing notes.  Pertinent labs & imaging results that were available during my care of the patient were reviewed by me and considered in my medical decision making (see chart for  details).     MDM:  Pain seems muscular,  No pe risk.  PERC negative  Wells 0 Chest xray  normal.  I will try pt on prednsione taper, muscle relaxer.   Final Clinical Impressions(s) / UC Diagnoses   Final diagnoses:  Muscle pain     Discharge Instructions      See your Physician for recheck in 2-3 days     ED Prescriptions     Medication Sig Dispense Auth. Provider   predniSONE (DELTASONE) 10 MG tablet 6,5,4,3,2,1 taper 21 tablet Cheron SchaumannSofia, Emelin Dascenzo K, PA-C   methocarbamol (ROBAXIN) 500 MG tablet Take 1 tablet (500 mg total) by mouth 4 (four) times daily. 20 tablet Sathvik Tiedt K, PA-C   albuterol (VENTOLIN HFA) 108 (90 Base) MCG/ACT inhaler Inhale 2 puffs into the lungs every 6 (six) hours as needed for wheezing or shortness of breath. 8 g Elson AreasSofia, Aster Eckrich K, [place]PA-C      PDMP not reviewed this encounter. An After Visit Summary was printed and given to the patient.    Elson AreasSofia, Azyriah Nevins K, [place]PA-C 07/07/21 [phone]

## 2021-08-12 DIAGNOSIS — B957 Other staphylococcus as the cause of diseases classified elsewhere: Secondary | ICD-10-CM | POA: Insufficient documentation

## 2021-10-17 ENCOUNTER — Emergency Department
Admission: RE | Admit: 2021-10-17 | Discharge: 2021-10-17 | Disposition: A | Discharge status: Home / Self Care | Payer: BC Managed Care – PPO | Source: Ambulatory Visit | Attending: Emergency Medicine | Admitting: Emergency Medicine

## 2021-10-17 VITALS — BP 123/82 | HR 94 | Temp 98.6°F | Resp 20 | Ht 66.0 in | Wt 240.0 lb

## 2021-10-17 DIAGNOSIS — L732 Hidradenitis suppurativa: Secondary | ICD-10-CM | POA: Diagnosis not present

## 2021-10-17 MED ORDER — FLUCONAZOLE 150 MG PO TABS
150.0000 mg | ORAL_TABLET | Freq: Once | ORAL | 1 refills | Status: AC
Start: 2021-10-17 — End: 2021-10-17

## 2021-10-17 MED ORDER — IBUPROFEN 600 MG PO TABS: 600.0000 mg | ORAL_TABLET | Freq: Four times a day (QID) | ORAL | 0 refills | Status: AC | PRN

## 2021-10-17 MED ORDER — METHYLPREDNISOLONE ACETATE 40 MG/ML IJ SUSP
40.0000 mg | Freq: Once | INTRAMUSCULAR | Status: AC
  Administered 2021-10-17: 40 mg via INTRAMUSCULAR

## 2021-10-17 MED ORDER — DOXYCYCLINE HYCLATE 100 MG PO CAPS: 100.0000 mg | ORAL_CAPSULE | Freq: Two times a day (BID) | ORAL | 0 refills | Status: AC

## 2021-10-17 MED ORDER — CHLORHEXIDINE GLUCONATE 4 % EX LIQD: Freq: Every day | CUTANEOUS | 0 refills | Status: AC | PRN

## 2021-10-17 NOTE — Discharge Instructions (Addendum)
Continue warm compresses.  Take 600 mg of ibuprofen combined with 1000 mg of Tylenol together 3-4 times a day as needed for pain.  Continue Hibiclens.  I have decided to put you on doxycycline for 2 weeks rather than 10 days.  I have given you a shot of Solu-Medrol 40 mg here in clinic today. ?

## 2021-10-17 NOTE — ED Triage Notes (Signed)
Pt presents to Urgent Care with c/o 2 abscesses to buttocks, an abscess below L breast and an abscess in L axilla--noticed 4 days ago and reports she is having a hidradenitis flare-up. Could not get appt w/ dermatology until December.  ?

## 2021-10-17 NOTE — ED Provider Notes (Signed)
HPI ? ?SUBJECTIVE: ? ?Allison York is a 41 y.o. female who presents with a hidradenitis suppurativa flare starting 5 days ago.  She has 5 lesions.  One under her left breast, 1 in her left axilla which are draining, 1 in her left lower abdomen and 2 on her buttocks.  She reports body aches.  No fevers.  She has been applying warm compresses, using Hibiclens, boil ease and ibuprofen 600 mg 3-4 times a day.  The ibuprofen helps.  Last dose of ibuprofen was within 6 hours of evaluation.  Symptoms are worse with palpation and clothing.  She has an appointment with dermatology on 5/23.  She has a past medical history of here hidradenitis suppurativa, MRSA infections, pseudotumor cerebri.  No history of diabetes.  LMP: Status post hysterectomy.  [facility] ? ? ? ?Past Medical History:  ?Diagnosis Date  ? Hidradenitis suppurativa   ? ? ?Past Surgical History:  ?Procedure Laterality Date  ? ABDOMINAL HYSTERECTOMY    ? AXILLARY HIDRADENITIS EXCISION    ? CESAREAN SECTION    ? ? ?Family History  ?Problem Relation Age of Onset  ? Cancer Mother   ? Diabetes Father   ? Hypertension Father   ? ? ?Social History  ? ?Tobacco Use  ? Smoking status: Never  ? Smokeless tobacco: Never  ?Vaping Use  ? Vaping Use: Never used  ?Substance Use Topics  ? Alcohol use: Yes  ?  Comment: occasionally  ? Drug use: Not Currently  ? ? ?No current facility-administered medications for this encounter. ? ?Current Outpatient Medications:  ?  chlorhexidine (HIBICLENS) 4 % external liquid, Apply topically daily as needed. Dilute 10-15 mL in water, Use daily when bathing for 1-2 weeks, Disp: 120 mL, Rfl: 0 ?  doxycycline (VIBRAMYCIN) 100 MG capsule, Take 1 capsule (100 mg total) by mouth 2 (two) times daily for 14 days., Disp: 28 capsule, Rfl: 0 ?  ibuprofen (ADVIL) 600 MG tablet, Take 1 tablet (600 mg total) by mouth every 6 (six) hours as needed., Disp: 30 tablet, Rfl: 0 ?  acetaZOLAMIDE (DIAMOX) 125 MG tablet, Take 125 mg by  mouth 3 (three) times daily., Disp: , Rfl:  ?  Adalimumab (HUMIRA) 10 MG/0.1ML PSKT, Inject into the skin., Disp: , Rfl:  ?  albuterol (VENTOLIN HFA) 108 (90 Base) MCG/ACT inhaler, Inhale 2 puffs into the lungs every 6 (six) hours as needed for wheezing or shortness of breath., Disp: 8 g, Rfl: 2 ?  dapsone 25 MG tablet, Take 25 mg by mouth daily., Disp: , Rfl:  ? ?No Known Allergies ? ? ?ROS ? ?As noted in HPI.  ? ?Physical Exam ? ?BP 123/82 (BP Location: Right Arm)   Pulse 94   Temp 98.6 ?F (37 ?C) (Oral)   Resp 20   Ht 5\' 6"  (1.676 m)   Wt 108.9 kg   SpO2 97%   BMI 38.74 kg/m?  ? ?Constitutional: Well developed, well nourished, no acute distress ?Eyes:  EOMI, conjunctiva normal bilaterally ?HENT: Normocephalic, atraumatic,mucus membranes moist ?Respiratory: Normal inspiratory effort ?Cardiovascular: Normal rate ?GI: nondistended ?skin: Multiple large areas consistent with hidradenitis suppurativa ? ?Left axilla ? ? ? ?Left breast ? ? ?Left lower abdomen ? ? ?Left buttock ? ? ?Right perineum ? ? ? ?Musculoskeletal: no deformities ?Neurologic: Alert & oriented x 3, no focal neuro deficits ?Psychiatric: Speech and behavior appropriate ? ? ?ED Course ? ? ?Medications  ?methylPREDNISolone acetate (DEPO-MEDROL) injection 40 mg (40 mg Intramuscular Given  10/17/21 1726)  ? ? ?No orders of the defined types were placed in this encounter. ? ? ?No results found for this or any previous visit (from the past 24 hour(s)). ?No results found. ? ?ED Clinical Impression ? ?1. Hidradenitis suppurativa   ?  ? ?ED Assessment/Plan ? ?Previous records reviewed.  She was given Depo-Medrol 40 mg on 12/24/2020 which she states significantly helped with her symptoms.  will do again today. ? ?Patient states that these are frequently infected with MRSA. ? ?Home with doxycycline for 14 days.,  Hibiclens, Tylenol/ibuprofen, Diflucan as she states she frequently gets yeast infections when she takes antibiotics.  Follow-up already arranged  with dermatology on 5/23. ? ?Discussed  MDM, treatment plan, and plan for follow-up with patient. Discussed sn/sx that should prompt return to the ED. patient agrees with plan.  ? ?Meds ordered this encounter  ?Medications  ? methylPREDNISolone acetate (DEPO-MEDROL) injection 40 mg  ? ibuprofen (ADVIL) 600 MG tablet  ?  Sig: Take 1 tablet (600 mg total) by mouth every 6 (six) hours as needed.  ?  Dispense:  30 tablet  ?  Refill:  0  ? chlorhexidine (HIBICLENS) 4 % external liquid  ?  Sig: Apply topically daily as needed. Dilute 10-15 mL in water, Use daily when bathing for 1-2 weeks  ?  Dispense:  120 mL  ?  Refill:  0  ? doxycycline (VIBRAMYCIN) 100 MG capsule  ?  Sig: Take 1 capsule (100 mg total) by mouth 2 (two) times daily for 14 days.  ?  Dispense:  28 capsule  ?  Refill:  0  ? fluconazole (DIFLUCAN) 150 MG tablet  ?  Sig: Take 1 tablet (150 mg total) by mouth once for 1 dose. 1 tab po x 1. May repeat in 72 hours if no improvement  ?  Dispense:  2 tablet  ?  Refill:  1  ? ? ? ? ?*This clinic note was created using Scientist, clinical (histocompatibility and immunogenetics). Therefore, there may be occasional mistakes despite careful proofreading. ? ?? ? ?  ?Domenick Gong, MD ?10/18/21 1238 ? ?

## 2022-01-29 IMAGING — DX DG CHEST 2V
2 series · 2 of 2 positions shown · non-contrast
Comparison: Chest x-ray 04/16/2020

CLINICAL DATA: Cough

EXAM:
CHEST - 2 VIEW

[chest pa]
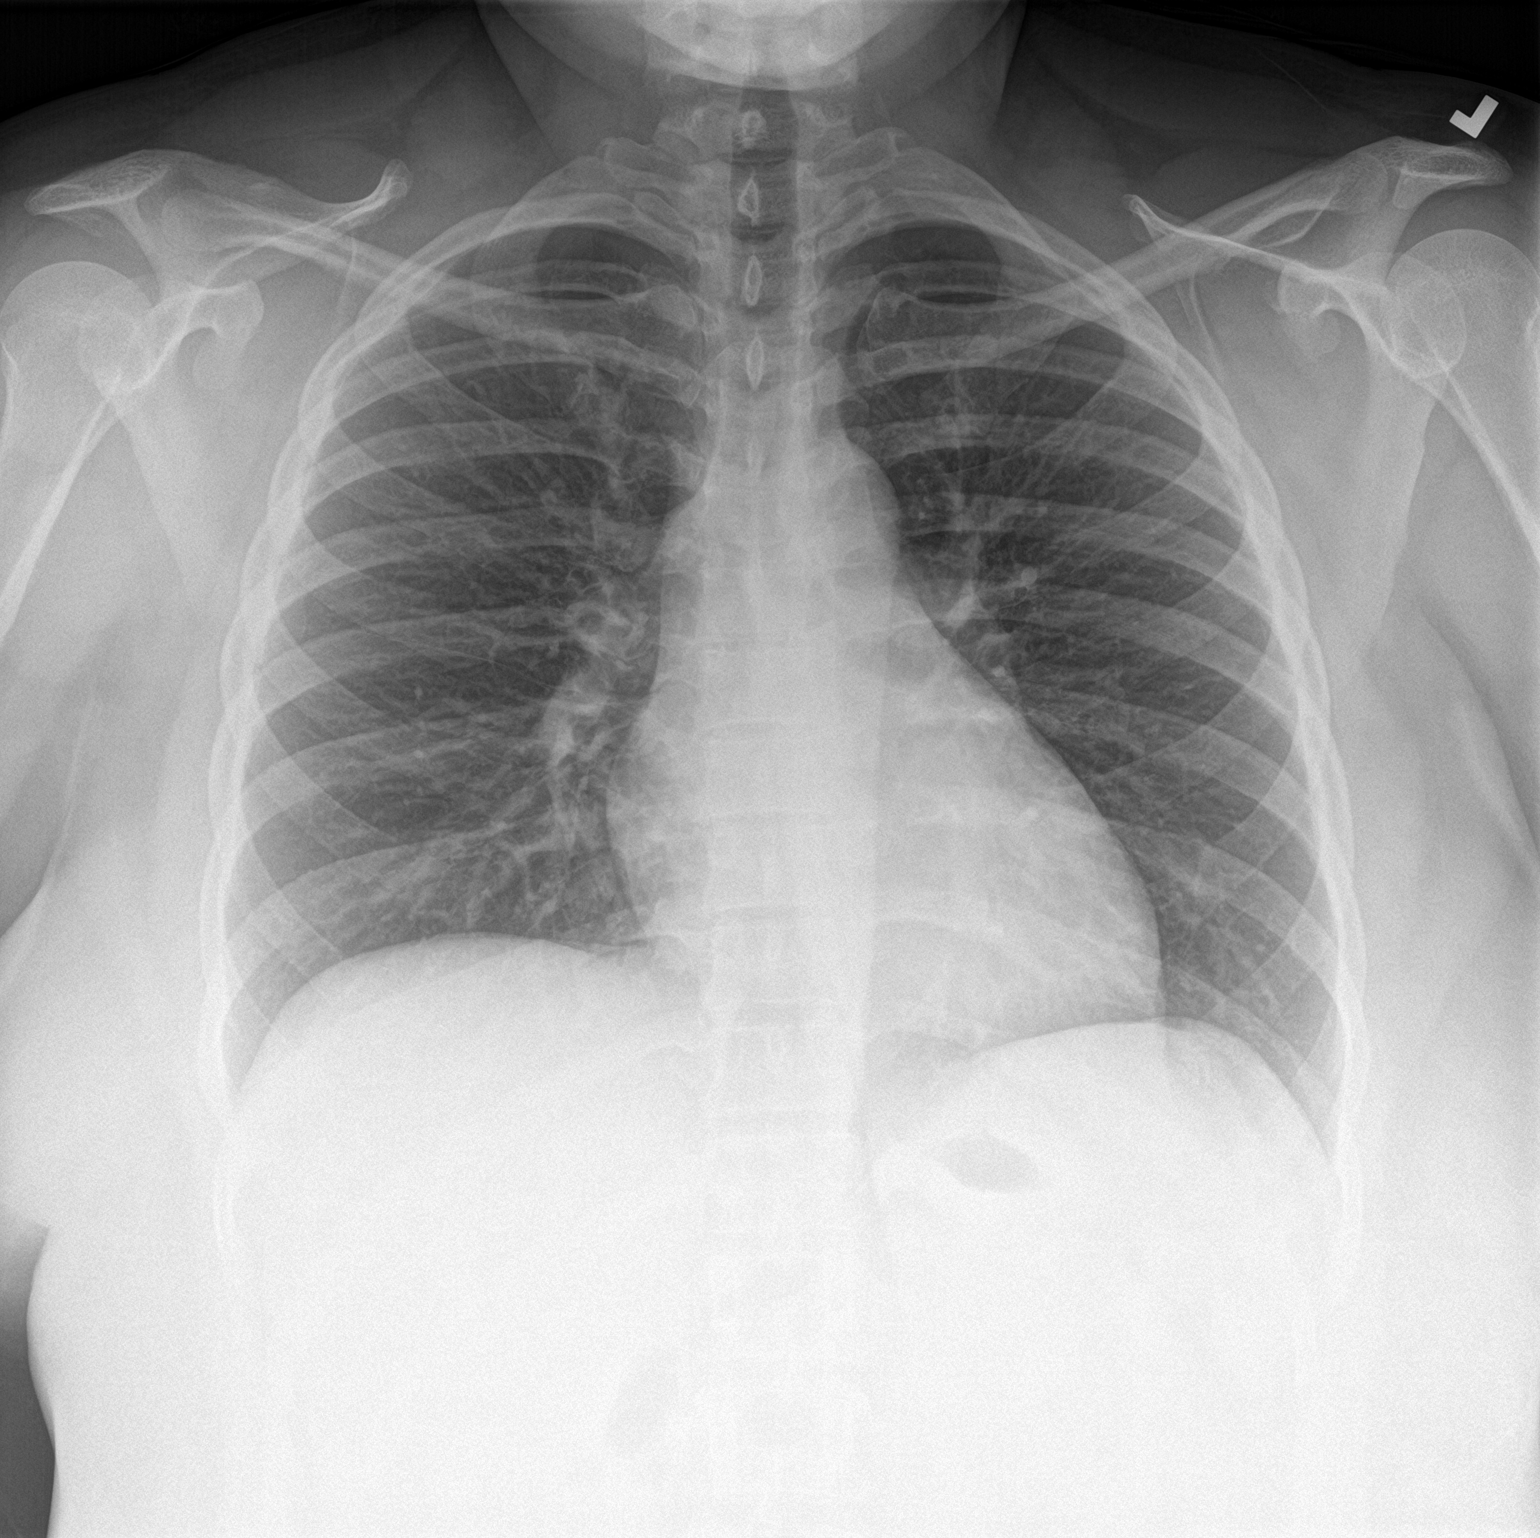

[chest lat]
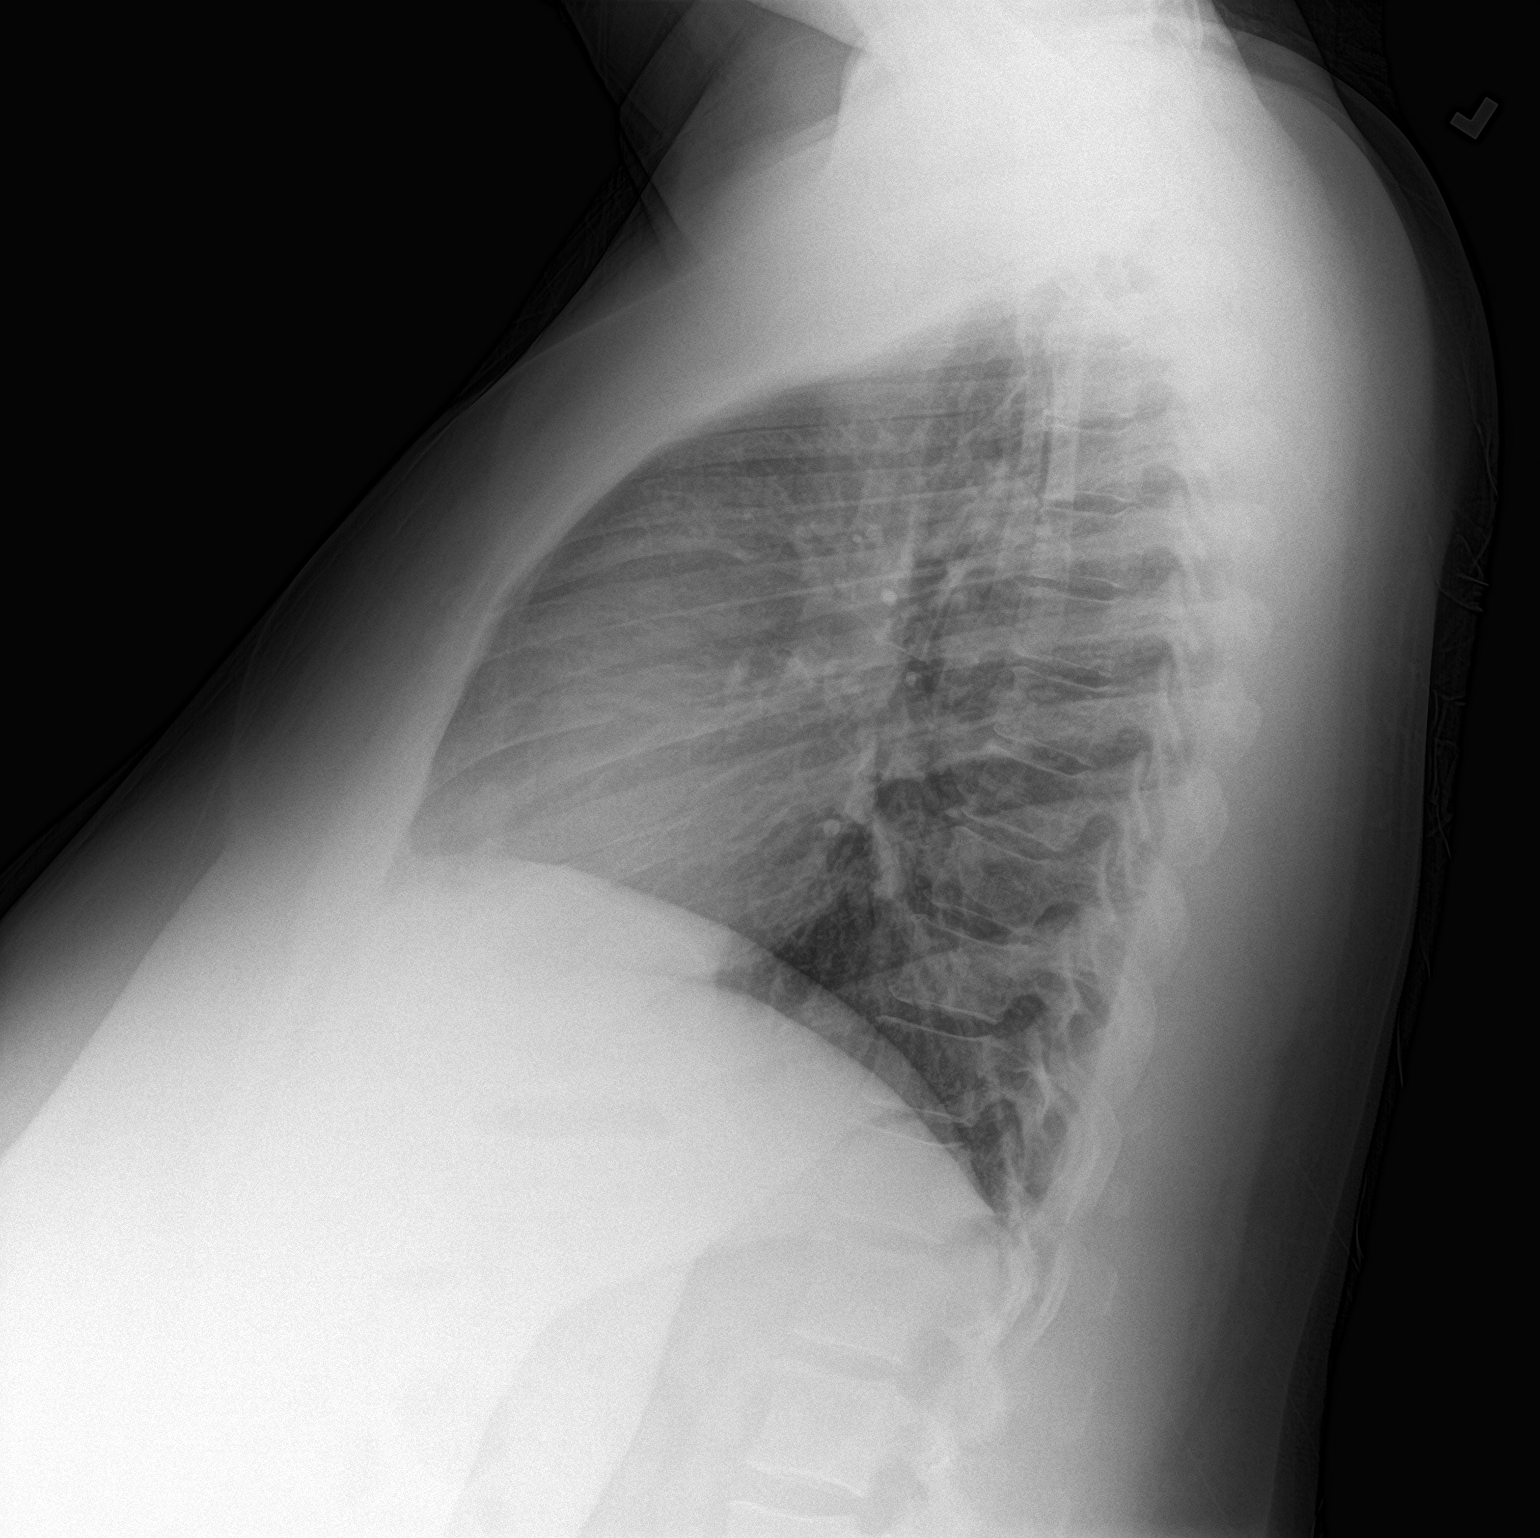

[2 of 2 positions shown; findings below may reference images not displayed]

FINDINGS: Heart size and mediastinal contours are within normal limits. No
suspicious pulmonary opacities identified.

No pleural effusion or pneumothorax visualized.

No acute osseous abnormality appreciated.
IMPRESSION: No acute intrathoracic process identified.

## 2022-04-01 DIAGNOSIS — R42 Dizziness and giddiness: Secondary | ICD-10-CM | POA: Insufficient documentation

## 2022-04-29 ENCOUNTER — Ambulatory Visit: Payer: Self-pay

## 2022-05-01 ENCOUNTER — Ambulatory Visit
Admission: RE | Admit: 2022-05-01 | Discharge: 2022-05-01 | Disposition: A | Discharge status: Home / Self Care | Payer: BC Managed Care – PPO | Source: Ambulatory Visit | Attending: Family Medicine | Admitting: Family Medicine

## 2022-05-01 ENCOUNTER — Ambulatory Visit (INDEPENDENT_AMBULATORY_CARE_PROVIDER_SITE_OTHER): Payer: BC Managed Care – PPO

## 2022-05-01 VITALS — BP 148/91 | HR 87 | Temp 98.6°F | Resp 17

## 2022-05-01 DIAGNOSIS — J069 Acute upper respiratory infection, unspecified: Secondary | ICD-10-CM | POA: Diagnosis not present

## 2022-05-01 DIAGNOSIS — R053 Chronic cough: Secondary | ICD-10-CM

## 2022-05-01 DIAGNOSIS — J4521 Mild intermittent asthma with (acute) exacerbation: Secondary | ICD-10-CM

## 2022-05-01 MED ORDER — PREDNISONE 20 MG PO TABS: ORAL_TABLET | ORAL | 0 refills | Status: DC

## 2022-05-01 MED ORDER — METHYLPREDNISOLONE SODIUM SUCC 125 MG IJ SOLR
125.0000 mg | Freq: Once | INTRAMUSCULAR | Status: AC
  Administered 2022-05-01: 125 mg via INTRAMUSCULAR

## 2022-05-01 MED ORDER — ALBUTEROL SULFATE (2.5 MG/3ML) 0.083% IN NEBU: 2.5000 mg | INHALATION_SOLUTION | Freq: Four times a day (QID) | RESPIRATORY_TRACT | 0 refills | Status: AC | PRN

## 2022-05-01 MED ORDER — ALBUTEROL SULFATE (2.5 MG/3ML) 0.083% IN NEBU
2.5000 mg | INHALATION_SOLUTION | Freq: Once | RESPIRATORY_TRACT | Status: AC
  Administered 2022-05-01: 2.5 mg via RESPIRATORY_TRACT

## 2022-05-01 NOTE — Discharge Instructions (Signed)
Take the prednisone as directed.  You will take 40 mg a day for 5 days then 20 mg a day for 5 days then discontinue Use your albuterol inhaler or solution as needed Drink lots of water Put a humidifier in your bedroom if there is 1 available See your doctor in the next week or 2

## 2022-05-01 NOTE — ED Provider Notes (Signed)
[facility]    CSN: [id number] Arrival date & time: 05/01/22  1654      History   Chief Complaint Chief Complaint  Patient presents with   Wheezing    W/ SOB   Cough    Appt 5pm    HPI Allison York is a 42 y.o. female.   HPI  Patient states that she has had cough and wheezing for the last month.  She was using an inhaler that she was given for COVID.  With this stopped working she started using her children's asthma nebulizer.  This works better but she still has wheezing and shortness of breath.  Fatigue.  No fever or chills.  No runny nose or sore throat.  She had COVID January 2023.  She recovered completely from this infection.  Has had COVID vaccinations.  No underlying asthma or lung disease.  Non-smoker. Patient has idiopathic intracranial hypertension and takes Diamox.  She also has hidradenitis suppurativa and is on Humira  Past Medical History:  Diagnosis Date   Hidradenitis suppurativa     There are no problems to display for this patient.   Past Surgical History:  Procedure Laterality Date   ABDOMINAL HYSTERECTOMY     AXILLARY HIDRADENITIS EXCISION     CESAREAN SECTION      OB History   No obstetric history on file.      Home Medications    Prior to Admission medications   Medication Sig Start Date End Date Taking? Authorizing Provider  albuterol (PROVENTIL) (2.5 MG/3ML) 0.083% nebulizer solution Take 3 mLs (2.5 mg total) by nebulization every 6 (six) hours as needed for wheezing or shortness of breath. 05/01/22  Yes Eustace Moore, MD  predniSONE (DELTASONE) 20 MG tablet Take 2 pills a day for 5 days, then take 1 pill a day, then discontinue 05/01/22  Yes Eustace Moore, MD  acetaZOLAMIDE (DIAMOX) 125 MG tablet Take 125 mg by mouth 3 (three) times daily.    [provider]  Adalimumab (HUMIRA) 10 MG/0.1ML PSKT Inject into the skin.    [provider]  chlorhexidine (HIBICLENS) 4 % external liquid Apply  topically daily as needed. Dilute 10-15 mL in water, Use daily when bathing for 1-2 weeks 10/17/21   Domenick Gong, MD  dapsone 25 MG tablet Take 25 mg by mouth daily. 08/25/20   [provider]  ibuprofen (ADVIL) 600 MG tablet Take 1 tablet (600 mg total) by mouth every 6 (six) hours as needed. 10/17/21   Domenick Gong, MD    Family History Family History  Problem Relation Age of Onset   Cancer Mother    Diabetes Father    Hypertension Father     Social History Social History   Tobacco Use   Smoking status: Never   Smokeless tobacco: Never  Vaping Use   Vaping Use: Never used  Substance Use Topics   Alcohol use: Yes    Comment: occasionally   Drug use: Not Currently     Allergies   Patient has no known allergies.   Review of Systems Review of Systems  See HPI Physical Exam Triage Vital Signs ED Triage Vitals  Enc Vitals Group     BP 05/01/22 1659 (!) 148/91     Pulse Rate 05/01/22 1659 87     Resp 05/01/22 1659 17     Temp 05/01/22 1659 98.6 F (37 C)     Temp Source 05/01/22 1659 Oral     SpO2 05/01/22  1659 98 %     Weight --      Height --      Head Circumference --      Peak Flow --      Pain Score 05/01/22 1701 0     Pain Loc --      Pain Edu? --      Excl. in GC? --    No data found.  Updated Vital Signs BP (!) 148/91 (BP Location: Right Arm)   Pulse 87   Temp 98.6 F (37 C) (Oral)   Resp 17   SpO2 98%       Physical Exam Constitutional:      General: She is not in acute distress.    Appearance: She is well-developed. She is obese. She is ill-appearing.  HENT:     Head: Normocephalic and atraumatic.     Right Ear: Tympanic membrane and ear canal normal.     Left Ear: Tympanic membrane and ear canal normal.     Nose: Nose normal.     Mouth/Throat:     Mouth: Mucous membranes are moist.  Eyes:     Conjunctiva/sclera: Conjunctivae normal.     Pupils: Pupils are equal, round, and reactive to light.  Cardiovascular:      Rate and Rhythm: Normal rate and regular rhythm.     Heart sounds: Normal heart sounds.  Pulmonary:     Effort: Pulmonary effort is normal. No respiratory distress.     Breath sounds: Wheezing and rhonchi present.     Comments: Patient inspiration limited due to wheezing.  Frequent cough.  Dyspnea Abdominal:     General: There is no distension.     Palpations: Abdomen is soft.  Musculoskeletal:        General: Normal range of motion.     Cervical back: Normal range of motion.  Lymphadenopathy:     Cervical: No cervical adenopathy.  Skin:    General: Skin is warm and dry.  Neurological:     Mental Status: She is alert.  Psychiatric:        Mood and Affect: Mood normal.        Behavior: Behavior normal.      UC Treatments / Results  Labs (all labs ordered are listed, but only abnormal results are displayed) Labs Reviewed - No data to display  EKG   Radiology DG Chest 2 View  Result Date: 05/01/2022 CLINICAL DATA:  Cough x 1 month EXAM: CHEST - 2 VIEW COMPARISON:  Radiograph 07/07/2021 FINDINGS: Unchanged cardiomediastinal silhouette. There is no focal airspace consolidation. There is no pleural effusion or pneumothorax. There is no acute osseous abnormality. IMPRESSION: No evidence of acute cardiopulmonary disease. Electronically Signed   By: Caprice Renshaw M.D.   On: 05/01/2022 18:13    Procedures Procedures (including critical care time)  Medications Ordered in UC Medications  albuterol (PROVENTIL) (2.5 MG/3ML) 0.083% nebulizer solution 2.5 mg (2.5 mg Nebulization Given 05/01/22 1714)  methylPREDNISolone sodium succinate (SOLU-MEDROL) 125 mg/2 mL injection 125 mg (125 mg Intramuscular Given 05/01/22 1820)    Initial Impression / Assessment and Plan / UC Course  I have reviewed the triage vital signs and the nursing notes.  Pertinent labs & imaging results that were available during my care of the patient were reviewed by me and considered in my medical decision making  (see chart for details).     Has been wheezing and having progressive difficulty for a month.  She never had asthma  before.  I suspect she may have had a viral infection that is resulting in wheezing.  Perhaps RSV.  She states her children have not been sick.  At this point she needs prednisone and inhalers.  Follow-up with PCP Final Clinical Impressions(s) / UC Diagnoses   Final diagnoses:  Mild intermittent reactive airway disease with acute exacerbation     Discharge Instructions      Take the prednisone as directed.  You will take 40 mg a day for 5 days then 20 mg a day for 5 days then discontinue Use your albuterol inhaler or solution as needed Drink lots of water Put a humidifier in your bedroom if there is 1 available See your doctor in the next week or 2     ED Prescriptions     Medication Sig Dispense Auth. Provider   albuterol (PROVENTIL) (2.5 MG/3ML) 0.083% nebulizer solution Take 3 mLs (2.5 mg total) by nebulization every 6 (six) hours as needed for wheezing or shortness of breath. 75 mL Eustace Moore, MD   predniSONE (DELTASONE) 20 MG tablet Take 2 pills a day for 5 days, then take 1 pill a day, then discontinue 15 tablet Delton See Letta Pate, MD      PDMP not reviewed this encounter.   Eustace Moore, MD 05/01/22 [phone]

## 2022-05-01 NOTE — ED Triage Notes (Signed)
Pt c/o cough, wheezing and SOB x 1 month. Says its hard to take a deep breath. Using Inhaler prn. Has also been using her childrens nebulizer. Has also trying mucinex and vapor rub prn.

## 2022-05-02 ENCOUNTER — Telehealth: Payer: Self-pay | Admitting: Emergency Medicine

## 2022-10-07 DIAGNOSIS — R0602 Shortness of breath: Secondary | ICD-10-CM | POA: Insufficient documentation

## 2022-10-07 DIAGNOSIS — J189 Pneumonia, unspecified organism: Secondary | ICD-10-CM | POA: Insufficient documentation

## 2023-01-24 ENCOUNTER — Ambulatory Visit: Payer: BC Managed Care – PPO

## 2023-01-24 ENCOUNTER — Encounter: Payer: Self-pay | Admitting: Emergency Medicine

## 2023-01-24 ENCOUNTER — Ambulatory Visit
Admission: EM | Admit: 2023-01-24 | Discharge: 2023-01-24 | Disposition: A | Discharge status: Home / Self Care | Payer: BC Managed Care – PPO

## 2023-01-24 ENCOUNTER — Other Ambulatory Visit: Payer: Self-pay

## 2023-01-24 DIAGNOSIS — R0600 Dyspnea, unspecified: Secondary | ICD-10-CM | POA: Diagnosis not present

## 2023-01-24 DIAGNOSIS — J4521 Mild intermittent asthma with (acute) exacerbation: Secondary | ICD-10-CM

## 2023-01-24 DIAGNOSIS — R059 Cough, unspecified: Secondary | ICD-10-CM | POA: Diagnosis not present

## 2023-01-24 DIAGNOSIS — R0602 Shortness of breath: Secondary | ICD-10-CM

## 2023-01-24 MED ORDER — PREDNISONE 10 MG (21) PO TBPK: ORAL_TABLET | Freq: Every day | ORAL | 0 refills | Status: DC

## 2023-01-24 MED ORDER — METHYLPREDNISOLONE SODIUM SUCC 125 MG IJ SOLR
125.0000 mg | Freq: Once | INTRAMUSCULAR | Status: AC
Start: 2023-01-24 — End: 2023-01-24
  Administered 2023-01-24: 125 mg via INTRAMUSCULAR

## 2023-01-24 MED ORDER — ALBUTEROL SULFATE (2.5 MG/3ML) 0.083% IN NEBU
2.5000 mg | INHALATION_SOLUTION | Freq: Once | RESPIRATORY_TRACT | Status: AC
  Administered 2023-01-24: 2.5 mg via RESPIRATORY_TRACT

## 2023-01-24 NOTE — ED Provider Notes (Signed)
[facility]    CSN: [id number] Arrival date & time: 01/24/23  0818      History   Chief Complaint Chief Complaint  Patient presents with   Shortness of Breath    HPI Julieann Herko is a 42 y.o. female.   HPI 42 year old female presents with shortness of breath for 1 week.  Reports has been using her albuterol nebulizer at home every 4-6 hours.  Patient is scheduled to follow-up with her PCP on 02/05/2023.  Epic chart reviewed reveals patient evaluated/treated in local ED on 10/07/2022 for shortness of breath.  PMH significant for morbid obesity hidradenitis suppurative, and idiopathic intracranial hypertension  Past Medical History:  Diagnosis Date   Hidradenitis suppurativa     There are no problems to display for this patient.   Past Surgical History:  Procedure Laterality Date   ABDOMINAL HYSTERECTOMY     AXILLARY HIDRADENITIS EXCISION     CESAREAN SECTION      OB History   No obstetric history on file.      Home Medications    Prior to Admission medications   Medication Sig Start Date End Date Taking? Authorizing Provider  acetaZOLAMIDE (DIAMOX) 125 MG tablet Take 125 mg by mouth 3 (three) times daily.   Yes [provider]  albuterol (PROVENTIL) (2.5 MG/3ML) 0.083% nebulizer solution Take 3 mLs (2.5 mg total) by nebulization every 6 (six) hours as needed for wheezing or shortness of breath. 05/01/22  Yes Eustace Moore, MD  predniSONE (STERAPRED UNI-PAK 21 TAB) 10 MG (21) TBPK tablet Take by mouth daily. Take 6 tabs by mouth daily  for 2 days, then 5 tabs for 2 days, then 4 tabs for 2 days, then 3 tabs for 2 days, 2 tabs for 2 days, then 1 tab by mouth daily for 2 days 01/24/23  Yes Trevor Iha, FNP  Adalimumab (HUMIRA) 10 MG/0.1ML PSKT Inject into the skin.    [provider]  chlorhexidine (HIBICLENS) 4 % external liquid Apply topically daily as needed. Dilute 10-15 mL in water, Use daily when bathing for 1-2 weeks 10/17/21    Domenick Gong, MD  dapsone 25 MG tablet Take 25 mg by mouth daily. 08/25/20   [provider]  HYRIMOZ 40 MG/0.4ML SOAJ Inject into the skin.    [provider]  ibuprofen (ADVIL) 600 MG tablet Take 1 tablet (600 mg total) by mouth every 6 (six) hours as needed. 10/17/21   Domenick Gong, MD    Family History Family History  Problem Relation Age of Onset   Cancer Mother    Diabetes Father    Hypertension Father     Social History Social History   Tobacco Use   Smoking status: Never   Smokeless tobacco: Never  Vaping Use   Vaping status: Never Used  Substance Use Topics   Alcohol use: Yes    Comment: occasionally   Drug use: Not Currently     Allergies   Patient has no known allergies.   Review of Systems Review of Systems  Respiratory:  Positive for cough and shortness of breath.   All other systems reviewed and are negative.    Physical Exam Triage Vital Signs ED Triage Vitals  Encounter Vitals Group     BP 01/24/23 0841 (!) 140/85     Systolic BP Percentile --      Diastolic BP Percentile --      Pulse Rate 01/24/23 0841 91     Resp 01/24/23 0841 Marland Kitchen)  24     Temp 01/24/23 0841 98.6 F (37 C)     Temp Source 01/24/23 0841 Oral     SpO2 01/24/23 0841 95 %     Weight 01/24/23 0844 240 lb (108.9 kg)     Height 01/24/23 0844 5\' 7"  (1.702 m)     Head Circumference --      Peak Flow --      Pain Score 01/24/23 0844 0     Pain Loc --      Pain Education --      Exclude from Growth Chart --    No data found.  Updated Vital Signs BP (!) 140/85 (BP Location: Right Arm)   Pulse 91   Temp 98.6 F (37 C) (Oral)   Resp (!) 24   Ht 5\' 7"  (1.702 m)   Wt 240 lb (108.9 kg)   SpO2 95%   BMI 37.59 kg/m       Physical Exam Vitals and nursing note reviewed.  Constitutional:      Appearance: Normal appearance. She is obese. She is not ill-appearing.  HENT:     Head: Normocephalic and atraumatic.     Right Ear: Tympanic membrane, ear  canal and external ear normal.     Left Ear: Tympanic membrane, ear canal and external ear normal.     Mouth/Throat:     Mouth: Mucous membranes are moist.     Pharynx: Oropharynx is clear.  Eyes:     Extraocular Movements: Extraocular movements intact.     Conjunctiva/sclera: Conjunctivae normal.     Pupils: Pupils are equal, round, and reactive to light.  Cardiovascular:     Rate and Rhythm: Normal rate and regular rhythm.     Pulses: Normal pulses.     Heart sounds: Normal heart sounds.  Pulmonary:     Effort: Pulmonary effort is normal.     Breath sounds: Rhonchi present. No wheezing or rales.     Comments: Diffuse scattered rhonchi noted with infrequent nonproductive cough, respirations 24-28; post DuoNeb/Solu-Medrol 125 mg IM: Breath sounds improved throughout diminished bibasilarly, respirations 18-20 Musculoskeletal:        General: Normal range of motion.     Cervical back: Normal range of motion and neck supple.  Skin:    General: Skin is warm and dry.  Neurological:     General: No focal deficit present.     Mental Status: She is alert and oriented to person, place, and time. Mental status is at baseline.  Psychiatric:        Mood and Affect: Mood normal.        Behavior: Behavior normal.        Thought Content: Thought content normal.      UC Treatments / Results  Labs (all labs ordered are listed, but only abnormal results are displayed) Labs Reviewed - No data to display  EKG   Radiology DG Chest 2 View  Result Date: 01/24/2023 CLINICAL DATA:  Cough and dyspnea for 1 week EXAM: CHEST - 2 VIEW COMPARISON:  05/01/2022 chest radiograph. FINDINGS: Stable cardiomediastinal silhouette with normal heart size. No pneumothorax. No pleural effusion. Lungs appear clear, with no acute consolidative airspace disease and no pulmonary edema. IMPRESSION: No active cardiopulmonary disease. Electronically Signed   By: Delbert Phenix M.D.   On: 01/24/2023 09:05     Procedures Procedures (including critical care time)  Medications Ordered in UC Medications  methylPREDNISolone sodium succinate (SOLU-MEDROL) 125 mg/2 mL injection 125 mg (  125 mg Intramuscular Given 01/24/23 0913)  albuterol (PROVENTIL) (2.5 MG/3ML) 0.083% nebulizer solution 2.5 mg (2.5 mg Nebulization Given 01/24/23 0913)    Initial Impression / Assessment and Plan / UC Course  I have reviewed the triage vital signs and the nursing notes.  Pertinent labs & imaging results that were available during my care of the patient were reviewed by me and considered in my medical decision making (see chart for details).     MDM: 1.  Shortness of breath-CXR revealed above, IM Solu-Medrol 125 mg given once in clinic prior to discharge, DuoNeb nebulization (2.5 mg / 3 mL) 0.083% nebulizer solution 2.5 mg given once in clinic prior to discharge; 2.  Mild intermittent reactive airway disease with acute exacerbation-Rx'd Sterapred Unipak (tapering from 60 mg to 10 mg over 10 days). Advised patient may use DuoNeb nebulizer at home for shortness of breath every 6 hours.  Advised patient to take medication as directed with food to completion.  Encouraged increase daily water intake to 64 ounces per day while taking this medication.  Advised if symptoms worsen and/or unresolved please follow-up with PCP or here for further evaluation.  Patient discharged home, hemodynamically stable.  Final Clinical Impressions(s) / UC Diagnoses   Final diagnoses:  SOB (shortness of breath)  Mild intermittent reactive airway disease with acute exacerbation     Discharge Instructions      Advised patient may use DuoNeb nebulizer at home for shortness of breath every 6 hours.  Advised patient to take medication as directed with food to completion.  Encouraged increase daily water intake to 64 ounces per day while taking this medication.  Advised if symptoms worsen and/or unresolved please follow-up with PCP or here for  further evaluation.     ED Prescriptions     Medication Sig Dispense Auth. Provider   predniSONE (STERAPRED UNI-PAK 21 TAB) 10 MG (21) TBPK tablet Take by mouth daily. Take 6 tabs by mouth daily  for 2 days, then 5 tabs for 2 days, then 4 tabs for 2 days, then 3 tabs for 2 days, 2 tabs for 2 days, then 1 tab by mouth daily for 2 days 42 tablet Trevor Iha, FNP      PDMP not reviewed this encounter.   Trevor Iha, FNP 01/24/23 1208

## 2023-01-24 NOTE — Discharge Instructions (Addendum)
Advised patient may use DuoNeb nebulizer at home for shortness of breath every 6 hours.  Advised patient to take medication as directed with food to completion.  Encouraged increase daily water intake to 64 ounces per day while taking this medication.  Advised if symptoms worsen and/or unresolved please follow-up with PCP or here for further evaluation.

## 2023-01-24 NOTE — ED Triage Notes (Signed)
Patient states that she's becoming very winded for the past week.  Worse in the morning, having to use her nebulizer w/Albuterol every 4-6 hours.  Patient does have a "barky cough" as well.  No official diagnosis of asthma.  Follow up with PCP on 02/05/2023.

## 2023-01-28 ENCOUNTER — Telehealth: Payer: Self-pay | Admitting: Family Medicine

## 2023-01-28 ENCOUNTER — Telehealth: Payer: Self-pay

## 2023-01-28 MED ORDER — QVAR REDIHALER 80 MCG/ACT IN AERB: 1.0000 | INHALATION_SPRAY | Freq: Two times a day (BID) | RESPIRATORY_TRACT | 0 refills | Status: AC

## 2023-01-28 NOTE — Telephone Encounter (Signed)
Patient called as instructed by provider on previous visit

## 2023-01-28 NOTE — Telephone Encounter (Signed)
Patient reports shortness of breath is improved with oral steroids.  Advised is still using DuoNeb 1-2 times daily.  Advised patient we will send in Qvar inhaler 1 puff twice daily.  Advised patient to follow-up with PCP for further evaluation including pulmonary consult for PFT.  Patient agreed and verbalized understanding of these instructions and this plan of care today.

## 2023-02-24 ENCOUNTER — Telehealth: Payer: Self-pay | Admitting: *Deleted

## 2023-02-24 NOTE — Telephone Encounter (Signed)
Left patient a message to call the office prior to appointment to receive and give information.

## 2023-03-09 ENCOUNTER — Telehealth: Payer: Self-pay | Admitting: *Deleted

## 2023-03-09 NOTE — Telephone Encounter (Signed)
Patient given all appointment information.

## 2023-03-16 ENCOUNTER — Ambulatory Visit: Payer: BC Managed Care – PPO | Admitting: Obstetrics & Gynecology

## 2023-03-16 ENCOUNTER — Encounter: Payer: Self-pay | Admitting: Obstetrics & Gynecology

## 2023-03-16 ENCOUNTER — Other Ambulatory Visit (HOSPITAL_COMMUNITY)
Admission: RE | Admit: 2023-03-16 | Discharge: 2023-03-16 | Disposition: A | Discharge status: Home / Self Care | Payer: BC Managed Care – PPO | Source: Ambulatory Visit | Attending: Obstetrics & Gynecology | Admitting: Obstetrics & Gynecology

## 2023-03-16 VITALS — BP 153/83 | HR 98 | Ht 67.0 in | Wt 251.0 lb

## 2023-03-16 DIAGNOSIS — Z113 Encounter for screening for infections with a predominantly sexual mode of transmission: Secondary | ICD-10-CM | POA: Insufficient documentation

## 2023-03-16 DIAGNOSIS — L732 Hidradenitis suppurativa: Secondary | ICD-10-CM

## 2023-03-16 DIAGNOSIS — Z90711 Acquired absence of uterus with remaining cervical stump: Secondary | ICD-10-CM

## 2023-03-16 DIAGNOSIS — Z01419 Encounter for gynecological examination (general) (routine) without abnormal findings: Secondary | ICD-10-CM

## 2023-03-16 NOTE — Progress Notes (Signed)
  Subjective:     Allison York is a 42 y.o. female here for a routine exam.  Current complaints: vaginal itching--1 week; no discharge.  Tried monistat x1 which helped some.  .     Gynecologic History No LMP recorded. Patient has had a hysterectomy. Contraception: status post hysterectomy Last Mammogram: Never had one- scheduled for 11/8 Last Pap Smear:  10/04/20- negative Last Colon Screening;  n/a Seat Belts:   yes Sun Screen:   yes Dental Check Up:  yes Brush & Floss:  yes     Obstetric History OB History  No obstetric history on file.     The following portions of the patient's history were reviewed and updated as appropriate: allergies, current medications, past family history, past medical history, past social history, past surgical history, and problem list.  Review of Systems Pertinent items noted in HPI and remainder of comprehensive ROS otherwise negative.    Objective:     Vitals:   03/16/23 1358  BP: (!) 143/85  Pulse: 99  Weight: 251 lb (113.9 kg)  Height: 5\' 7"  (1.702 m)   Vitals:  WNL General appearance: alert, cooperative and no distress  HEENT: Normocephalic, without obvious abnormality, atraumatic Eyes: negative Throat: lips, mucosa, and tongue normal; teeth and gums normal  Respiratory: Clear to auscultation bilaterally  CV: Regular rate and rhythm  Breasts:  Normal appearance, no masses or tenderness, no nipple retraction or dimpling (see skin exam below)  GI: Soft, non-tender; bowel sounds normal; no masses,  no organomegaly  GU: External Genitalia:  Tanner V, no lesion Urethra:  No prolapse   Vagina: Pink, normal rugae, no blood or discharge  Cervix: No CMT, no lesion  Uterus:  Surgically absent  Adnexa: Normal, no masses, non tender  Musculoskeletal: No edema, redness or tenderness in the calves or thighs  Skin: No lesions or rash; abscess/HS tract on left breast--sees dermatologist; not painful and on [place]; if worsens will contact  dermatologist.   Lymphatic: Axillary adenopathy: none     Psychiatric: Normal mood and behavior        Assessment:    Healthy female exam.  Hidradenitis supprativa   Plan:    Pap with co testing Genetics referral -- fam Hx of GI, liver and breast cancers HS flare--contact dermatologist for antibiotics if worsens STD screening Vaginal itching--wet prep / aptima BP elevated--pt to f/u with PCP Mammogram ordered.

## 2023-03-17 LAB — CERVICOVAGINAL ANCILLARY ONLY
Bacterial Vaginitis (gardnerella): NEGATIVE
Candida Glabrata: NEGATIVE
Candida Vaginitis: NEGATIVE
Chlamydia: NEGATIVE
Comment: NEGATIVE
Comment: NEGATIVE
Comment: NEGATIVE
Comment: NEGATIVE
Comment: NEGATIVE
Comment: NORMAL
Neisseria Gonorrhea: NEGATIVE
Trichomonas: NEGATIVE

## 2023-03-18 LAB — CYTOLOGY - PAP
Comment: NEGATIVE
Diagnosis: NEGATIVE
High risk HPV: NEGATIVE

## 2023-04-10 ENCOUNTER — Other Ambulatory Visit: Payer: Self-pay | Admitting: Physician Assistant

## 2023-04-10 DIAGNOSIS — Z780 Asymptomatic menopausal state: Secondary | ICD-10-CM

## 2023-05-27 ENCOUNTER — Ambulatory Visit: Payer: BC Managed Care – PPO

## 2023-05-27 DIAGNOSIS — Z1231 Encounter for screening mammogram for malignant neoplasm of breast: Secondary | ICD-10-CM

## 2023-05-27 DIAGNOSIS — Z780 Asymptomatic menopausal state: Secondary | ICD-10-CM

## 2023-07-16 DIAGNOSIS — L304 Erythema intertrigo: Secondary | ICD-10-CM | POA: Insufficient documentation

## 2024-03-30 DIAGNOSIS — B350 Tinea barbae and tinea capitis: Secondary | ICD-10-CM | POA: Insufficient documentation

## 2024-03-30 DIAGNOSIS — L700 Acne vulgaris: Secondary | ICD-10-CM | POA: Insufficient documentation

## 2024-05-26 ENCOUNTER — Other Ambulatory Visit: Payer: Self-pay | Admitting: Physician Assistant

## 2024-05-26 DIAGNOSIS — Z1231 Encounter for screening mammogram for malignant neoplasm of breast: Secondary | ICD-10-CM

## 2024-06-03 ENCOUNTER — Ambulatory Visit (INDEPENDENT_AMBULATORY_CARE_PROVIDER_SITE_OTHER)

## 2024-06-03 DIAGNOSIS — Z1231 Encounter for screening mammogram for malignant neoplasm of breast: Secondary | ICD-10-CM | POA: Diagnosis not present

## 2024-07-03 ENCOUNTER — Ambulatory Visit
Admission: RE | Admit: 2024-07-03 | Discharge: 2024-07-03 | Disposition: A | Discharge status: Home / Self Care | Source: Ambulatory Visit | Attending: Family Medicine | Admitting: Family Medicine

## 2024-07-03 VITALS — BP 128/80 | HR 71 | Temp 98.2°F | Resp 18

## 2024-07-03 DIAGNOSIS — L739 Follicular disorder, unspecified: Secondary | ICD-10-CM | POA: Insufficient documentation

## 2024-07-03 DIAGNOSIS — N3001 Acute cystitis with hematuria: Secondary | ICD-10-CM

## 2024-07-03 DIAGNOSIS — N83209 Unspecified ovarian cyst, unspecified side: Secondary | ICD-10-CM | POA: Insufficient documentation

## 2024-07-03 DIAGNOSIS — L0390 Cellulitis, unspecified: Secondary | ICD-10-CM | POA: Insufficient documentation

## 2024-07-03 LAB — POCT URINALYSIS DIP (MANUAL ENTRY)
Glucose, UA: NEGATIVE mg/dL
Nitrite, UA: POSITIVE — AB
Protein Ur, POC: 100 mg/dL — AB
Spec Grav, UA: 1.015
Urobilinogen, UA: 2 U/dL — AB
pH, UA: 5

## 2024-07-03 MED ORDER — SULFAMETHOXAZOLE-TRIMETHOPRIM 800-160 MG PO TABS: 1.0000 | ORAL_TABLET | Freq: Two times a day (BID) | ORAL | 0 refills | Status: AC

## 2024-07-03 NOTE — ED Triage Notes (Signed)
 Patient presents to UC for urinary freq x Tuesday. She states she had a telehealth appt and she was prescribed keflex and pyridium. She states this morning she developed bladder pressure, urgency, and dysuria.

## 2024-07-03 NOTE — Discharge Instructions (Addendum)
 Advised patient take medication as directed with food to completion.  Advised we will follow-up with urine culture results once received.  Encouraged increase daily water intake to 64 ounces per day while taking this medication.  Advised if symptoms worsen and/or unresolved please follow-up with your PCP, [facility] urology, or here for further evaluation.

## 2024-07-03 NOTE — ED Provider Notes (Signed)
 " [facility]    CSN: [id number] Arrival date & time: 07/03/24  1021      History   Chief Complaint Chief Complaint  Patient presents with   Urinary Frequency    Entered by patient    HPI Bryttany Tortorelli is a 44 y.o. female.   HPI pleasant 44 year old female presents with urinary frequency.  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   Pneumonia of both lower lobes due to infectious organism 10/07/2022   Shortness of breath 10/07/2022   Vertigo 04/01/2022   Multiple-resistant Staphylococcus aureus infection 08/12/2021   Medication monitoring encounter 06/22/2020   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      Home Medications    Prior to Admission medications  Medication Sig Start Date End Date Taking? Authorizing Provider  phenazopyridine (PYRIDIUM) 200 MG tablet Take 200 mg by mouth 3 (three) times daily as needed. 06/29/24  Yes [provider]  sulfamethoxazole -trimethoprim  (BACTRIM  DS) 800-160 MG tablet Take 1 tablet by mouth 2 (two) times daily for 7 days. 07/03/24 07/10/24 Yes Ark Agrusa,  FNP  Adalimumab (HUMIRA) 10 MG/0.1ML PSKT Inject into the skin.    [provider]  albuterol  (PROVENTIL ) (2.5 MG/3ML) 0.083% nebulizer solution Take 3 mLs (2.5 mg total) by nebulization every 6 (six) hours as needed for wheezing or shortness of breath. 05/01/22   Maranda Jamee Jacob, MD  beclomethasone (QVAR  REDIHALER) 80 MCG/ACT inhaler Inhale 1 puff into the lungs 2 (two) times daily. 01/28/23   Teddy Sharper, FNP  chlorhexidine  (HIBICLENS ) 4 % external liquid Apply topically daily as needed. Dilute 10-15 mL in water, Use daily when bathing for 1-2 weeks 10/17/21   Van Knee, MD  HYRIMOZ 40 MG/0.4ML SOAJ Inject into the skin.    [provider]  ibuprofen  (ADVIL ) 600 MG tablet Take 1 tablet (600 mg total) by mouth every 6 (six) hours as needed. 10/17/21   Mortenson, Ashley, MD  spironolactone (ALDACTONE) 25 MG tablet Take 50 mg by mouth daily.    [provider]    Family History Family History  Problem Relation Age of Onset   Thyroid cancer Mother    Bladder Cancer Mother    Diabetes Father    Hypertension Father    Breast cancer Maternal Grandmother    Throat cancer Maternal Grandmother    Cancer Paternal Grandmother    Cancer - Colon Paternal Grandfather    Liver cancer Maternal Aunt    Liver cancer Paternal Uncle     Social History Social History[1]   Allergies   Patient has no known allergies.   Review of Systems Review of Systems  Genitourinary:  Positive for frequency.  All other systems reviewed and are negative.    Physical Exam Triage Vital Signs ED Triage Vitals [07/03/24 1036]  Encounter Vitals Group     BP      Girls Systolic BP Percentile      Girls Diastolic BP Percentile      Boys Systolic BP Percentile      Boys Diastolic BP Percentile      Pulse Rate 71     Resp 18     Temp 98.2 F (36.8 C)     Temp Source Oral     SpO2 97 %     Weight      Height      Head Circumference      Peak Flow      Pain Score 6      Pain Loc      Pain Education      Exclude from Growth Chart    No data found.  Updated Vital Signs BP 128/80 (BP Location: Right Arm)   Pulse 71   Temp 98.2 F (36.8 C) (Oral)   Resp 18   SpO2 97%     Physical Exam Vitals and nursing note reviewed.  Constitutional:      General: She is not in acute distress.    Appearance: Normal appearance. She is obese. She is not ill-appearing.  HENT:     Head: Normocephalic and atraumatic.     Mouth/Throat:     Mouth: Mucous membranes are moist.     Pharynx: Oropharynx is clear.  Eyes:     Extraocular Movements: Extraocular movements intact.     Conjunctiva/sclera: Conjunctivae normal.     Pupils: Pupils are equal, round, and reactive to light.  Cardiovascular:     Rate and Rhythm: Normal rate and regular rhythm.     Heart sounds: Normal heart sounds.  Pulmonary:     Effort: Pulmonary effort is normal.     Breath sounds: Normal breath sounds. No wheezing, rhonchi or rales.  Abdominal:     Tenderness: There is no right CVA tenderness or left CVA tenderness.  Musculoskeletal:        General: Normal range of motion.  Skin:    General: Skin is warm and dry.  Neurological:     General: No focal deficit present.     Mental Status: She is alert and oriented to person, place, and time. Mental status is at baseline.  Psychiatric:        Mood and Affect: Mood normal.        Behavior: Behavior normal.      UC Treatments / Results  Labs (all labs ordered are listed, but only abnormal results are displayed) Labs Reviewed  POCT URINALYSIS DIP (MANUAL ENTRY) - Abnormal; Notable for the following components:      Result Value   Color, UA orange (*)    Bilirubin, UA small (*)    Ketones, POC UA small (15) (*)    Blood, UA trace-intact (*)    Protein Ur, POC =100 (*)    Urobilinogen, UA 2.0 (*)    Nitrite, UA Positive (*)    Leukocytes, UA Large (3+) (*)    All other components within normal limits  URINE CULTURE     EKG   Radiology No results found.  Procedures Procedures (including critical care time)  Medications Ordered in UC Medications - No data to display  Initial Impression / Assessment and Plan / UC  Course  I have reviewed the triage vital signs and the nursing notes.  Pertinent labs & imaging results that were available during my care of the patient were reviewed by me and considered in my medical decision making (see chart for details).     MDM: 1.  Acute cystitis with hematuria-Rx'd Bactrim  DS 800/160 mg tablet: Take 1 tablet twice daily x 7 days, UA revealed above, urine culture ordered. Advised patient take medication as directed with food to completion.  Advised we will follow-up with urine culture results once received.  Encouraged increase daily water intake to 64 ounces per day while taking this medication.  Advised if symptoms worsen and/or unresolved please follow-up with your PCP, [facility], or here for further evaluation.  Patient discharged home, hemodynamically stable. Final Clinical Impressions(s) / UC Diagnoses   Final diagnoses:  Acute cystitis with hematuria     Discharge Instructions      Advised patient take medication as directed with food to completion.  Advised we will follow-up with urine culture results once received.  Encouraged increase daily water intake to 64 ounces per day while taking this medication.  Advised if symptoms worsen and/or unresolved please follow-up with your PCP, [facility] urology, or here for further evaluation.     ED Prescriptions     Medication Sig Dispense Auth. Provider   sulfamethoxazole -trimethoprim  (BACTRIM  DS) 800-160 MG tablet Take 1 tablet by mouth 2 (two) times daily for 7 days. 14 tablet Cortez Steelman, FNP      PDMP not reviewed this encounter.    [1]  Social History Tobacco Use   Smoking status: Never   Smokeless tobacco: Never  Vaping Use   Vaping status: Never Used  Substance Use Topics    Alcohol use: Yes    Comment: occasionally   Drug use: Not Currently     Teddy Sharper, FNP 07/03/24 1105  "

## 2024-07-04 LAB — URINE CULTURE: Culture: NO GROWTH

## 2024-07-09 ENCOUNTER — Ambulatory Visit
